# Patient Record
Sex: Female | Born: 1970 | Race: Black or African American | Hispanic: No | Marital: Married | Smoking: Former smoker
Health system: Southern US, Community
[De-identification: ages and names within clinical notes are randomized; demographics above are authoritative.]

## PROBLEM LIST (undated history)

## (undated) DIAGNOSIS — E7800 Pure hypercholesterolemia, unspecified: Secondary | ICD-10-CM

## (undated) HISTORY — PX: KIDNEY STONE SURGERY: SHX686

## (undated) HISTORY — DX: Pure hypercholesterolemia, unspecified: E78.00

---

## 1997-11-10 ENCOUNTER — Emergency Department (HOSPITAL_COMMUNITY): Admission: EM | Admit: 1997-11-10 | Discharge: 1997-11-10 | Payer: Self-pay | Admitting: Emergency Medicine

## 1997-11-10 ENCOUNTER — Encounter: Payer: Self-pay | Admitting: Emergency Medicine

## 1998-05-26 ENCOUNTER — Ambulatory Visit (HOSPITAL_COMMUNITY): Admission: RE | Admit: 1998-05-26 | Discharge: 1998-05-26 | Payer: Self-pay | Admitting: Urology

## 1998-05-26 ENCOUNTER — Encounter: Payer: Self-pay | Admitting: Urology

## 1999-03-11 ENCOUNTER — Emergency Department (HOSPITAL_COMMUNITY): Admission: EM | Admit: 1999-03-11 | Discharge: 1999-03-11 | Payer: Self-pay | Admitting: *Deleted

## 2001-06-04 ENCOUNTER — Encounter: Payer: Self-pay | Admitting: Emergency Medicine

## 2001-06-04 ENCOUNTER — Emergency Department (HOSPITAL_COMMUNITY): Admission: EM | Admit: 2001-06-04 | Discharge: 2001-06-04 | Payer: Self-pay | Admitting: Emergency Medicine

## 2001-06-10 ENCOUNTER — Emergency Department (HOSPITAL_COMMUNITY): Admission: EM | Admit: 2001-06-10 | Discharge: 2001-06-10 | Payer: Self-pay | Admitting: Emergency Medicine

## 2001-08-10 ENCOUNTER — Emergency Department (HOSPITAL_COMMUNITY): Admission: EM | Admit: 2001-08-10 | Discharge: 2001-08-10 | Payer: Self-pay

## 2003-02-03 ENCOUNTER — Emergency Department (HOSPITAL_COMMUNITY): Admission: AD | Admit: 2003-02-03 | Discharge: 2003-02-03 | Payer: Self-pay | Admitting: Family Medicine

## 2004-10-16 ENCOUNTER — Emergency Department (HOSPITAL_COMMUNITY): Admission: EM | Admit: 2004-10-16 | Discharge: 2004-10-17 | Payer: Self-pay | Admitting: Emergency Medicine

## 2005-03-31 ENCOUNTER — Emergency Department (HOSPITAL_COMMUNITY): Admission: EM | Admit: 2005-03-31 | Discharge: 2005-03-31 | Payer: Self-pay | Admitting: Family Medicine

## 2006-07-03 ENCOUNTER — Emergency Department (HOSPITAL_COMMUNITY): Admission: EM | Admit: 2006-07-03 | Discharge: 2006-07-03 | Payer: Self-pay | Admitting: Emergency Medicine

## 2012-08-19 ENCOUNTER — Encounter (HOSPITAL_COMMUNITY): Payer: Self-pay | Admitting: Emergency Medicine

## 2012-08-19 ENCOUNTER — Emergency Department (HOSPITAL_COMMUNITY)
Admission: EM | Admit: 2012-08-19 | Discharge: 2012-08-19 | Disposition: A | Discharge status: Home / Self Care | Payer: Self-pay | Attending: Emergency Medicine | Admitting: Emergency Medicine

## 2012-08-19 DIAGNOSIS — F411 Generalized anxiety disorder: Secondary | ICD-10-CM | POA: Insufficient documentation

## 2012-08-19 DIAGNOSIS — R209 Unspecified disturbances of skin sensation: Secondary | ICD-10-CM | POA: Insufficient documentation

## 2012-08-19 DIAGNOSIS — R202 Paresthesia of skin: Secondary | ICD-10-CM

## 2012-08-19 DIAGNOSIS — R6884 Jaw pain: Secondary | ICD-10-CM | POA: Insufficient documentation

## 2012-08-19 DIAGNOSIS — K029 Dental caries, unspecified: Secondary | ICD-10-CM | POA: Insufficient documentation

## 2012-08-19 LAB — BASIC METABOLIC PANEL
BUN: 5 mg/dL — ABNORMAL LOW (ref 6–23)
CO2: 25 mEq/L (ref 19–32)
Calcium: 9.4 mg/dL (ref 8.4–10.5)
GFR calc non Af Amer: >90 mL/min (ref >90)
Glucose, Bld: 98 mg/dL (ref 70–99)
Potassium: 3.5 mEq/L (ref 3.5–5.1)

## 2012-08-19 LAB — CBC
HCT: 36 % (ref 36.0–46.0)
Hemoglobin: 11.4 g/dL — ABNORMAL LOW (ref 12.0–15.0)
MCH: 24.9 pg — ABNORMAL LOW (ref 26.0–34.0)
MCHC: 31.7 g/dL (ref 30.0–36.0)
RBC: 4.58 MIL/uL (ref 3.87–5.11)

## 2012-08-19 MED ORDER — NAPROXEN 500 MG PO TABS: 500.0000 mg | ORAL_TABLET | Freq: Two times a day (BID) | ORAL | Status: DC

## 2012-08-19 NOTE — ED Provider Notes (Signed)
History    CSN: [id number] Arrival date & time 08/19/12  1150  First MD Initiated Contact with Patient 08/19/12 1158     Chief Complaint  Patient presents with  . Jaw Pain   (Consider location/radiation/quality/duration/timing/severity/associated sxs/prior Treatment) HPI Comments: Patient with no significant past medical history presents with complaint of left arm numbness and weakness it woke the patient from sleep overnight. The symptoms lasted for several minutes and gradually resolved. She continues to complain about mild tingling in the fingers. Symptoms seem to be worse with certain positions. No current weakness or numbness. No neck pain or shoulder pain. Patient also notes that she has had some mild left lower jaw pain for the past week. She denies dental pain or swelling. She took ibuprofen without relief. No chest pain or shortness of breath. Patient is concerned that she had a stroke. Onset of symptoms gradual. Course is constant.  The history is provided by the patient.   History reviewed. No pertinent past medical history. Past Surgical History  Procedure Laterality Date  . Kidney stone surgery     No family history on file. History  Substance Use Topics  . Smoking status: Never Smoker   . Smokeless tobacco: Not on file  . Alcohol Use: No   OB History   Grav Para Term Preterm Abortions TAB SAB Ect Mult Living                 Review of Systems  Constitutional: Negative for fever and activity change.  HENT: Positive for dental problem. Negative for ear pain, sore throat, facial swelling, rhinorrhea, trouble swallowing and neck pain.   Eyes: Negative for redness.  Respiratory: Negative for cough, shortness of breath and stridor.   Cardiovascular: Negative for chest pain.  Gastrointestinal: Negative for nausea, vomiting, abdominal pain and diarrhea.  Genitourinary: Negative for dysuria.  Musculoskeletal: Positive for arthralgias. Negative for myalgias, back pain and  joint swelling.  Skin: Negative for color change, rash and wound.  Neurological: Positive for numbness. Negative for weakness and headaches.    Allergies  Review of patient's allergies indicates no known allergies.  Home Medications   Current Outpatient Rx  Name  Route  Sig  Dispense  Refill  . ibuprofen (ADVIL,MOTRIN) 200 MG tablet   Oral   Take 400 mg by mouth every 8 (eight) hours as needed for pain.          BP 115/77  Pulse 64  Temp(Src) 98.8 F (37.1 C) (Oral)  Resp 16  SpO2 100%  LMP 08/12/2012 Physical Exam  Nursing note and vitals reviewed. Constitutional: She is oriented to person, place, and time. She appears well-developed and well-nourished.  HENT:  Head: Normocephalic and atraumatic. No trismus in the jaw.  Right Ear: Tympanic membrane, external ear and ear canal normal.  Left Ear: Tympanic membrane, external ear and ear canal normal.  Nose: Nose normal.  Mouth/Throat: Uvula is midline, oropharynx is clear and moist and mucous membranes are normal. Mucous membranes are not dry. Abnormal dentition. Dental caries present. No dental abscesses or edematous. No tonsillar abscesses.  No dental swelling or erythema noted on exam. Full ROM of jaw.   Eyes: Conjunctivae are normal. Right eye exhibits no discharge. Left eye exhibits no discharge.  Neck: Trachea normal and normal range of motion. Neck supple. Normal carotid pulses and no JVD present. No muscular tenderness present. Carotid bruit is not present. No tracheal deviation present.  No neck swelling or Ludwig's angina  Cardiovascular:  Normal rate, regular rhythm, S1 normal, S2 normal, normal heart sounds and intact distal pulses.  Exam reveals no decreased pulses.   No murmur heard. Pulmonary/Chest: Effort normal and breath sounds normal. No respiratory distress. She has no wheezes. She exhibits no tenderness.  Abdominal: Soft. Normal aorta and bowel sounds are normal. There is no tenderness. There is no rebound  and no guarding.  Musculoskeletal: Normal range of motion. She exhibits no tenderness.  Lymphadenopathy:    She has no cervical adenopathy.  Neurological: She is alert and oriented to person, place, and time. She has normal strength and normal reflexes. No cranial nerve deficit or sensory deficit. Coordination normal. GCS eye subscore is 4. GCS verbal subscore is 5. GCS motor subscore is 6.  Skin: Skin is warm and dry. She is not diaphoretic. No cyanosis. No pallor.  Psychiatric: Her mood appears anxious.    ED Course  Procedures (including critical care time) Labs Reviewed  CBC - Abnormal; Notable for the following:    Hemoglobin 11.4 (*)    MCH 24.9 (*)    All other components within normal limits  BASIC METABOLIC PANEL - Abnormal; Notable for the following:    BUN 5 (*)    All other components within normal limits  POCT I-STAT TROPONIN I   No results found. 1. Paresthesia   2. Jaw pain     1:19 PM Patient seen and examined. Work-up initiated per nursing protocol (chest pain). Patient exam is normal. She appears anxious.   Vital signs reviewed and are as follows: Filed Vitals:   08/19/12 1203  BP: 115/77  Pulse: 64  Temp: 98.8 F (37.1 C)  Resp: 16     Date: 08/19/2012  Rate: 60  Rhythm: normal sinus rhythm  QRS Axis: normal  Intervals: normal  ST/T Wave abnormalities: normal  Conduction Disutrbances:none  Narrative Interpretation:   Old EKG Reviewed: unchanged from 07/03/2006  2:33 PM Troponin nml.   Pt informed of results. Patient has had no recurrent symptoms in emergency department.  Patient informs to use NSAIDs on conservative management. She is to followup with orthopedic if continuing to have intermittent symptoms in one week.  Patient urged to return with worsening or other symptoms. Patient counseled to return if they have weakness in their arms or legs, slurred speech, trouble walking or talking, confusion, trouble with their balance, or if they have  any other concerns. Patient verbalizes understanding and agrees with plan.   MDM  Left upper extremity paresthesia. Per history, this is positional. It is short lived and is improved with movement. Patient had no color change or signs of limb ischemia. Her pulses are normal in emergency department. She has no history of blood clots. Do not suspect DVT. Suspect peripheral neurological etiology given the positional nature. Do not suspect stroke. Patient is otherwise neurologically intact. Feel patient is stable for discharge home with orthopedic followup.  Jaw pain. No evidence of trauma or fracture. Evidence of dental infection or skin infection. Treat with NSAIDs. Full range of motion in jaw.    Renne Crigler, PA-C 08/19/12 [phone]

## 2012-08-19 NOTE — ED Notes (Addendum)
Pt from home c/o L jaw, arm pain x 1 week. Pt denies SOB,CP. Pt denies injury or teeth problems. Pt adds that she is having tingling in her L fingers. Pt A&O and in NAD.

## 2012-08-19 NOTE — ED Provider Notes (Signed)
Medical screening examination/treatment/procedure(s) were performed by non-physician practitioner and as supervising physician I was immediately available for consultation/collaboration.  Doug Sou, MD 08/19/12 [phone]

## 2015-10-30 ENCOUNTER — Emergency Department (HOSPITAL_COMMUNITY): Payer: Self-pay

## 2015-10-30 ENCOUNTER — Encounter (HOSPITAL_COMMUNITY): Payer: Self-pay | Admitting: *Deleted

## 2015-10-30 ENCOUNTER — Emergency Department (HOSPITAL_COMMUNITY)
Admission: EM | Admit: 2015-10-30 | Discharge: 2015-10-30 | Disposition: A | Discharge status: Home / Self Care | Payer: Self-pay | Attending: Emergency Medicine | Admitting: Emergency Medicine

## 2015-10-30 DIAGNOSIS — Z791 Long term (current) use of non-steroidal anti-inflammatories (NSAID): Secondary | ICD-10-CM | POA: Insufficient documentation

## 2015-10-30 DIAGNOSIS — N3000 Acute cystitis without hematuria: Secondary | ICD-10-CM | POA: Insufficient documentation

## 2015-10-30 DIAGNOSIS — R202 Paresthesia of skin: Secondary | ICD-10-CM

## 2015-10-30 LAB — URINALYSIS, ROUTINE W REFLEX MICROSCOPIC
Bilirubin Urine: NEGATIVE
Glucose, UA: NEGATIVE mg/dL
Ketones, ur: 15 mg/dL — AB
Nitrite: POSITIVE — AB
Protein, ur: NEGATIVE mg/dL
Specific Gravity, Urine: 1.015 (ref 1.005–1.030)
pH: 5.5 (ref 5.0–8.0)

## 2015-10-30 LAB — CBC WITH DIFFERENTIAL/PLATELET
Basophils Absolute: 0 10*3/uL (ref 0.0–0.1)
Basophils Relative: 0 %
Eosinophils Absolute: 0.1 10*3/uL (ref 0.0–0.7)
Eosinophils Relative: 1 %
HCT: 36.4 % (ref 36.0–46.0)
Hemoglobin: 11.4 g/dL — ABNORMAL LOW (ref 12.0–15.0)
Lymphocytes Relative: 45 %
Lymphs Abs: 4 10*3/uL (ref 0.7–4.0)
MCH: 25 pg — ABNORMAL LOW (ref 26.0–34.0)
MCHC: 31.3 g/dL (ref 30.0–36.0)
MCV: 79.8 fL (ref 78.0–100.0)
Monocytes Absolute: 0.5 10*3/uL (ref 0.1–1.0)
Monocytes Relative: 5 %
Neutro Abs: 4.3 10*3/uL (ref 1.7–7.7)
Neutrophils Relative %: 49 %
Platelets: 370 10*3/uL (ref 150–400)
RBC: 4.56 MIL/uL (ref 3.87–5.11)
RDW: 14.9 % (ref 11.5–15.5)
WBC: 8.9 10*3/uL (ref 4.0–10.5)

## 2015-10-30 LAB — BASIC METABOLIC PANEL
Anion gap: 9 (ref 5–15)
BUN: <5 mg/dL — ABNORMAL LOW (ref 6–20)
CO2: 25 mmol/L (ref 22–32)
Calcium: 9.4 mg/dL (ref 8.9–10.3)
Chloride: 104 mmol/L (ref 101–111)
Creatinine, Ser: 0.73 mg/dL (ref 0.44–1.00)
GFR calc Af Amer: >60 mL/min (ref >60)
GFR calc non Af Amer: >60 mL/min (ref >60)
Glucose, Bld: 90 mg/dL (ref 65–99)
Potassium: 3.4 mmol/L — ABNORMAL LOW (ref 3.5–5.1)
Sodium: 138 mmol/L (ref 135–145)

## 2015-10-30 LAB — CBG MONITORING, ED: GLUCOSE-CAPILLARY: 81 mg/dL (ref 65–99)

## 2015-10-30 LAB — URINE MICROSCOPIC-ADD ON

## 2015-10-30 LAB — PREGNANCY, URINE: Preg Test, Ur: NEGATIVE

## 2015-10-30 MED ORDER — SODIUM CHLORIDE 0.9 % IV BOLUS (SEPSIS)
1000.0000 mL | Freq: Once | INTRAVENOUS | Status: AC
  Administered 2015-10-30: 1000 mL via INTRAVENOUS

## 2015-10-30 MED ORDER — CEPHALEXIN 500 MG PO CAPS: 1000.0000 mg | ORAL_CAPSULE | Freq: Two times a day (BID) | ORAL | 0 refills | Status: DC

## 2015-10-30 NOTE — Progress Notes (Signed)
Patient listed as no having insurance or a pcp.  EDCM spoke to patient at bedside.  Patient reports she has [employer] insurance.  [facility]

## 2015-10-30 NOTE — Discharge Instructions (Signed)
Increase her fluid intake.  Return here as needed.  Follow-up with your primary care doctor

## 2015-10-30 NOTE — ED Triage Notes (Signed)
C/o headache and tingling for about a week, tingling will go into both arms and fingers. Nothing relieves headache, c/o nausea.

## 2015-10-31 NOTE — ED Provider Notes (Signed)
[facility]-EMERGENCY DEPT Provider Note   CSN: [id number] Arrival date & time: 10/30/15  1508     History   Chief Complaint Chief Complaint  Patient presents with  . Dizziness  . Tingling    HPI Megan Dawson is a 45 y.o. female.  HPI Patient presents to the emergency department with tingling throughout her head and upper extremities.  Patient states that she has had headache for a week along with this tingling that is bilateral.  Patient states that nothing seems make the condition better or worse.  She states that she has had some nausea but no vomiting.  Patient states that she has never had symptoms like this in the past. The patient denies chest pain, shortness of breath,blurred vision, neck pain, fever, cough, weakness, numbness, dizziness, anorexia, edema, abdominal pain, nausea, vomiting, diarrhea, rash, back pain, dysuria, hematemesis, bloody stool, near syncope, or syncope. History reviewed. No pertinent past medical history.  There are no active problems to display for this patient.   Past Surgical History:  Procedure Laterality Date  . KIDNEY STONE SURGERY      OB History    No data available       Home Medications    Prior to Admission medications   Medication Sig Start Date End Date Taking? Authorizing Provider  cephALEXin (KEFLEX) 500 MG capsule Take 2 capsules (1,000 mg total) by mouth 2 (two) times daily. 10/30/15   Charlestine Night, PA-C  ibuprofen (ADVIL,MOTRIN) 200 MG tablet Take 400 mg by mouth every 8 (eight) hours as needed for pain.    Historical Provider, MD  naproxen (NAPROSYN) 500 MG tablet Take 1 tablet (500 mg total) by mouth 2 (two) times daily. Patient not taking: Reported on 10/30/2015 08/19/12   Renne Crigler, PA-C    Family History No family history on file.  Social History Social History  Substance Use Topics  . Smoking status: Never Smoker  . Smokeless tobacco: Never Used  . Alcohol use No     Allergies   Review of  patient's allergies indicates no known allergies.   Review of Systems Review of Systems All other systems negative except as documented in the HPI. All pertinent positives and negatives as reviewed in the HPI.  Physical Exam Updated Vital Signs BP 106/67   Pulse (!) 57   Temp 98.1 F (36.7 C) (Oral)   Resp 12   Ht 5\' 6"  (1.676 m)   LMP 10/29/2015 (Exact Date)   SpO2 100%   Physical Exam  Constitutional: She is oriented to person, place, and time. She appears well-developed and well-nourished. No distress.  HENT:  Head: Normocephalic and atraumatic.  Mouth/Throat: Oropharynx is clear and moist.  Eyes: Pupils are equal, round, and reactive to light.  Neck: Normal range of motion. Neck supple.  Cardiovascular: Normal rate, regular rhythm and normal heart sounds.  Exam reveals no gallop and no friction rub.   No murmur heard. Pulmonary/Chest: Effort normal and breath sounds normal. No respiratory distress. She has no wheezes.  Abdominal: Soft. Bowel sounds are normal. She exhibits no distension. There is no tenderness.  Neurological: She is alert and oriented to person, place, and time. She has normal strength and normal reflexes. No sensory deficit. She exhibits normal muscle tone. Coordination and gait normal. GCS eye subscore is 4. GCS verbal subscore is 5. GCS motor subscore is 6.  Skin: Skin is warm and dry. No rash noted. No erythema.  Psychiatric: She has a normal mood and affect. Her  behavior is normal.  Nursing note and vitals reviewed.    ED Treatments / Results  Labs (all labs ordered are listed, but only abnormal results are displayed) Labs Reviewed  BASIC METABOLIC PANEL - Abnormal; Notable for the following:       Result Value   Potassium 3.4 (*)    BUN <5 (*)    All other components within normal limits  CBC WITH DIFFERENTIAL/PLATELET - Abnormal; Notable for the following:    Hemoglobin 11.4 (*)    MCH 25.0 (*)    All other components within normal limits    URINALYSIS, ROUTINE W REFLEX MICROSCOPIC (NOT [facility]) - Abnormal; Notable for the following:    APPearance CLOUDY (*)    Hgb urine dipstick LARGE (*)    Ketones, ur 15 (*)    Nitrite POSITIVE (*)    Leukocytes, UA TRACE (*)    All other components within normal limits  URINE MICROSCOPIC-ADD ON - Abnormal; Notable for the following:    Squamous Epithelial / LPF 0-5 (*)    Bacteria, UA MANY (*)    All other components within normal limits  PREGNANCY, URINE  CBG MONITORING, ED    EKG  EKG Interpretation  Date/Time:  Friday October 30 2015 15:14:13 EDT Ventricular Rate:  61 PR Interval:    QRS Duration: 82 QT Interval:  424 QTC Calculation: 428 R Axis:   56 Text Interpretation:  Sinus rhythm Abnormal R-wave progression, early transition Confirmed by [facility]ARD[facility] MD, PEDRO ([id number]) on 10/30/2015 3:21:49 PM       Radiology Ct Head Wo Contrast  Result Date: 10/30/2015 CLINICAL DATA:  45 year old female with headache, neck pain and tingling in both arms and fingers for 1 week. EXAM: CT HEAD WITHOUT CONTRAST CT CERVICAL SPINE WITHOUT CONTRAST TECHNIQUE: Multidetector CT imaging of the head and cervical spine was performed following the standard protocol without intravenous contrast. Multiplanar CT image reconstructions of the cervical spine were also generated. COMPARISON:  None. FINDINGS: CT HEAD FINDINGS Brain: No evidence of infarction, hemorrhage, hydrocephalus, extra-axial collection or mass lesion/mass effect. Vascular: No hyperdense vessel or unexpected calcification. Skull: Normal. Negative for fracture or focal lesion. Sinuses/Orbits: No acute finding. Other: None. CT CERVICAL SPINE FINDINGS Alignment: Normal. Skull base and vertebrae: No acute fracture. No primary bone lesion or focal pathologic process. Soft tissues and spinal canal: No prevertebral fluid or swelling. No visible canal hematoma. Disc levels:  Unremarkable Upper chest: Negative. Other: None IMPRESSION: Unremarkable  noncontrast CTs of the head and cervical spine. Electronically Signed   By: Harmon PierJeffrey  Hu M.D.   On: 10/30/2015 19:02   Ct Cervical Spine Wo Contrast  Result Date: 10/30/2015 CLINICAL DATA:  45 year old female with headache, neck pain and tingling in both arms and fingers for 1 week. EXAM: CT HEAD WITHOUT CONTRAST CT CERVICAL SPINE WITHOUT CONTRAST TECHNIQUE: Multidetector CT imaging of the head and cervical spine was performed following the standard protocol without intravenous contrast. Multiplanar CT image reconstructions of the cervical spine were also generated. COMPARISON:  None. FINDINGS: CT HEAD FINDINGS Brain: No evidence of infarction, hemorrhage, hydrocephalus, extra-axial collection or mass lesion/mass effect. Vascular: No hyperdense vessel or unexpected calcification. Skull: Normal. Negative for fracture or focal lesion. Sinuses/Orbits: No acute finding. Other: None. CT CERVICAL SPINE FINDINGS Alignment: Normal. Skull base and vertebrae: No acute fracture. No primary bone lesion or focal pathologic process. Soft tissues and spinal canal: No prevertebral fluid or swelling. No visible canal hematoma. Disc levels:  Unremarkable Upper chest: Negative. Other:  None IMPRESSION: Unremarkable noncontrast CTs of the head and cervical spine. Electronically Signed   By: Harmon Pier M.D.   On: 10/30/2015 19:02    Procedures Procedures (including critical care time)  Medications Ordered in ED Medications  sodium chloride 0.9 % bolus 1,000 mL (0 mLs Intravenous Stopped 10/30/15 2123)     Initial Impression / Assessment and Plan / ED Course  I have reviewed the triage vital signs and the nursing notes.  Pertinent labs & imaging results that were available during my care of the patient were reviewed by me and considered in my medical decision making (see chart for details).  Clinical Course    I advised the patient of the results and all questions were answered feel that this is multifactorial and  she is feeling better following IV fluids.  The CT scans were reassuring.  The patient does have a urinary tract infection which could contribute to this along with some mild dehydration.  Advised patient to follow up with her primary care Dr. told to return here as needed  Final Clinical Impressions(s) / ED Diagnoses   Final diagnoses:  Acute cystitis without hematuria  Tingling    New Prescriptions Discharge Medication List as of 10/30/2015  9:05 PM    START taking these medications   Details  cephALEXin (KEFLEX) 500 MG capsule Take 2 capsules (1,000 mg total) by mouth 2 (two) times daily., Starting Fri 10/30/2015, Print         [employer], PA-C 10/31/15 0120    Rolland Porter, MD 11/03/15 1451

## 2020-02-12 ENCOUNTER — Other Ambulatory Visit: Payer: Self-pay

## 2020-02-12 ENCOUNTER — Ambulatory Visit
Admission: EM | Admit: 2020-02-12 | Discharge: 2020-02-12 | Disposition: A | Discharge status: Home / Self Care | Payer: 59 | Attending: Emergency Medicine | Admitting: Emergency Medicine

## 2020-02-12 DIAGNOSIS — K625 Hemorrhage of anus and rectum: Secondary | ICD-10-CM

## 2020-02-12 MED ORDER — HYDROCORTISONE ACETATE 25 MG RE SUPP: 25.0000 mg | Freq: Two times a day (BID) | RECTAL | 0 refills | Status: DC

## 2020-02-12 NOTE — Discharge Instructions (Signed)
We are checking your hemoglobin, I will call if abnormal Use Anusol suppositories twice daily to help stop bleeding Referral to gastroenterology placed for follow-up and likely colonoscopy Please go to emergency room if developing any abdominal pain, worsening bleeding, lightheadedness or dizziness

## 2020-02-12 NOTE — ED Provider Notes (Signed)
[facility]    CSN: [id number] Arrival date & time: 02/12/20  1204      History   Chief Complaint Chief Complaint  Patient presents with  . Rectal Bleeding    X 1 month    HPI Megan Dawson is a 49 y.o. female presenting today for evaluation of rectal bleeding.  Patient reports over the past month she has had bright red blood per rectum.  Worse over the past few days.  Mainly is outside of the stool.  Also notes with wiping.  Denies any swelling or pain.  History of external hemorrhoids, but does not feel similar.  Has had slight increase in lightheadedness and dizziness recently as well as some mild nausea.  Has never had colonoscopy.  Denies current abdominal pain.  Did report some mild right lower abdominal discomfort earlier today, but is unsure if this is related to being perimenopausal as she will occasionally have some vaginal bleeding following lower abdominal discomfort.  Normal oral intake.     HPI  History reviewed. No pertinent past medical history.  There are no problems to display for this patient.   Past Surgical History:  Procedure Laterality Date  . KIDNEY STONE SURGERY      OB History   No obstetric history on file.      Home Medications    Prior to Admission medications   Medication Sig Start Date End Date Taking? Authorizing Provider  hydrocortisone (ANUSOL-HC) 25 MG suppository Place 1 suppository (25 mg total) rectally 2 (two) times daily. 02/12/20  Yes Aalijah Lanphere, Junius Creamer, PA-C    Family History History reviewed. No pertinent family history.  Social History Social History   Tobacco Use  . Smoking status: Never Smoker  . Smokeless tobacco: Never Used  Vaping Use  . Vaping Use: Never used  Substance Use Topics  . Alcohol use: No  . Drug use: No     Allergies   Patient has no known allergies.   Review of Systems Review of Systems  Constitutional: Negative for fatigue and fever.  Eyes: Negative for visual  disturbance.  Respiratory: Negative for shortness of breath.   Cardiovascular: Negative for chest pain.  Gastrointestinal: Positive for blood in stool and nausea. Negative for abdominal pain and vomiting.  Musculoskeletal: Negative for arthralgias and joint swelling.  Skin: Negative for color change, rash and wound.  Neurological: Negative for dizziness, weakness, light-headedness and headaches.     Physical Exam Triage Vital Signs ED Triage Vitals  Enc Vitals Group     BP 02/12/20 1601 123/84     Pulse Rate 02/12/20 1601 77     Resp 02/12/20 1601 18     Temp --      Temp Source 02/12/20 1601 Oral     SpO2 02/12/20 1601 98 %     Weight --      Height --      Head Circumference --      Peak Flow --      Pain Score 02/12/20 1603 0     Pain Loc --      Pain Edu? --      Excl. in GC? --    No data found.  Updated Vital Signs BP 123/84 (BP Location: Left Arm)   Pulse 77   Resp 18   SpO2 98%   Visual Acuity Right Eye Distance:   Left Eye Distance:   Bilateral Distance:    Right Eye Near:   Left Eye  Near:    Bilateral Near:     Physical Exam Vitals and nursing note reviewed.  Constitutional:      Appearance: She is well-developed and well-nourished.     Comments: No acute distress  HENT:     Head: Normocephalic and atraumatic.     Nose: Nose normal.  Eyes:     Conjunctiva/sclera: Conjunctivae normal.  Cardiovascular:     Rate and Rhythm: Normal rate.  Pulmonary:     Effort: Pulmonary effort is normal. No respiratory distress.  Abdominal:     General: There is no distension.     Comments: Soft, nondistended, nontender to light deep palpation throughout abdomen  Genitourinary:    Comments: Rectum with small areas prior hemorrhoids, but no areas of erythema or tenderness, no palpable masses on rectal exam, and no obvious blood noted on glove Musculoskeletal:        General: Normal range of motion.     Cervical back: Neck supple.  Skin:    General: Skin is  warm and dry.  Neurological:     Mental Status: She is alert and oriented to person, place, and time.  Psychiatric:        Mood and Affect: Mood and affect normal.      UC Treatments / Results  Labs (all labs ordered are listed, but only abnormal results are displayed) Labs Reviewed  CBC    EKG   Radiology No results found.  Procedures Procedures (including critical care time)  Medications Ordered in UC Medications - No data to display  Initial Impression / Assessment and Plan / UC Course  I have reviewed the triage vital signs and the nursing notes.  Pertinent labs & imaging results that were available during my care of the patient were reviewed by me and considered in my medical decision making (see chart for details).     Rectal bleeding x1 month, referred to gastroenterology for further evaluation of bleeding.  Treating internal hemorrhoids with Anusol suppositories.  Checking CBC to check hemoglobin.  If worsening in the meantime to follow-up in the emergency room.  Discussed strict return precautions. Patient verbalized understanding and is agreeable with plan.  Final Clinical Impressions(s) / UC Diagnoses   Final diagnoses:  Rectal bleeding     Discharge Instructions     We are checking your hemoglobin, I will call if abnormal Use Anusol suppositories twice daily to help stop bleeding Referral to gastroenterology placed for follow-up and likely colonoscopy Please go to emergency room if developing any abdominal pain, worsening bleeding, lightheadedness or dizziness    ED Prescriptions    Medication Sig Dispense Auth. Provider   hydrocortisone (ANUSOL-HC) 25 MG suppository Place 1 suppository (25 mg total) rectally 2 (two) times daily. 16 suppository Simara Rhyner, [place] C, PA-C     PDMP not reviewed this encounter.   Lew Dawes, PA-C 02/12/20 1747

## 2020-02-12 NOTE — ED Triage Notes (Signed)
Patient is having bloody stools and states the blood drips into the toilet and is generally bright red but has also been dark. Pt states she is concerned about blood loss. Pt is aox4 and ambulatory.

## 2020-02-13 ENCOUNTER — Telehealth (INDEPENDENT_AMBULATORY_CARE_PROVIDER_SITE_OTHER): Payer: 59 | Admitting: Internal Medicine

## 2020-02-13 DIAGNOSIS — Z7689 Persons encountering health services in other specified circumstances: Secondary | ICD-10-CM | POA: Diagnosis not present

## 2020-02-13 DIAGNOSIS — K625 Hemorrhage of anus and rectum: Secondary | ICD-10-CM | POA: Diagnosis not present

## 2020-02-13 DIAGNOSIS — Z1231 Encounter for screening mammogram for malignant neoplasm of breast: Secondary | ICD-10-CM | POA: Diagnosis not present

## 2020-02-13 LAB — CBC
Hematocrit: 36.8 % (ref 34.0–46.6)
Hemoglobin: 11.4 g/dL (ref 11.1–15.9)
MCH: 24.7 pg — ABNORMAL LOW (ref 26.6–33.0)
MCHC: 31 g/dL — ABNORMAL LOW (ref 31.5–35.7)
MCV: 80 fL (ref 79–97)
Platelets: 315 10*3/uL (ref 150–450)
RBC: 4.61 x10E6/uL (ref 3.77–5.28)
RDW: 15.3 % (ref 11.7–15.4)
WBC: 8.8 10*3/uL (ref 3.4–10.8)

## 2020-02-13 NOTE — Progress Notes (Signed)
Virtual Visit via MyChart Video Note  I connected with Megan Dawson, on 02/13/2020 at 3:31 PM by MyChart Video due to the COVID-19 pandemic and verified that I am speaking with the correct person using two identifiers.   Consent: I discussed the limitations, risks, security and privacy concerns of performing an evaluation and management service by telephone and the availability of in person appointments. I also discussed with the patient that there may be a patient responsible charge related to this service. The patient expressed understanding and agreed to proceed.   Location of Patient: Home   Location of Provider: Clinic    Persons participating in Telemedicine visit: Jennier A Kristine Linea [facility] Dr. Earlene Plater      History of Present Illness: Patient has a visit to establish care. Has not been to a doctor in years. No significant PMH. Remote surgical history of kidney stone removal >20 years ago.   She went to [facility] yesterday for rectal bleeding. Thought to likely be related to hemorrhoids and was given an ointment. Referral was placed to GI for colonoscopy and she has an appointment scheduled for 1/19.   Not UTD on PAP or mammogram.    No past medical history on file. No Known Allergies  Current Outpatient Medications on File Prior to Visit  Medication Sig Dispense Refill  . hydrocortisone (ANUSOL-HC) 25 MG suppository Place 1 suppository (25 mg total) rectally 2 (two) times daily. 16 suppository 0   No current facility-administered medications on file prior to visit.    Observations/Objective: NAD. Speaking clearly.  Work of breathing normal.  Alert and oriented. Mood appropriate.   Assessment and Plan: 1. Encounter to establish care Reviewed patient's PMH, social history, surgical history, and medications.  Is overdue for annual exam, screening blood work, and health maintenance topics. Have asked patient to return for visit to address these items.    2. Rectal bleeding Has GI follow up scheduled. Needs colonoscopy especially given age and has never had one in past.   3. Encounter for screening mammogram for malignant neoplasm of breast - MM DIGITAL SCREENING BILATERAL; Future   Follow Up Instructions: Annual exam    I discussed the assessment and treatment plan with the patient. The patient was provided an opportunity to ask questions and all were answered. The patient agreed with the plan and demonstrated an understanding of the instructions.   The patient was advised to call back or seek an in-person evaluation if the symptoms worsen or if the condition fails to improve as anticipated.     I provided 10 minutes total of non-face-to-face time during this encounter including median intraservice time, reviewing previous notes, investigations, ordering medications, medical decision making, coordinating care and patient verbalized understanding at the end of the visit.    Marcy Siren, D.O. Primary Care at [facility]  02/13/2020, 3:31 PM

## 2020-03-04 ENCOUNTER — Ambulatory Visit: Payer: 59 | Admitting: Gastroenterology

## 2020-03-06 ENCOUNTER — Encounter: Payer: 59 | Admitting: Internal Medicine

## 2020-03-16 ENCOUNTER — Encounter: Payer: 59 | Admitting: Internal Medicine

## 2020-03-25 ENCOUNTER — Ambulatory Visit: Payer: 59 | Admitting: Gastroenterology

## 2020-03-27 ENCOUNTER — Encounter: Payer: Self-pay | Admitting: Internal Medicine

## 2020-03-27 ENCOUNTER — Ambulatory Visit (INDEPENDENT_AMBULATORY_CARE_PROVIDER_SITE_OTHER): Payer: 59 | Admitting: Internal Medicine

## 2020-03-27 ENCOUNTER — Other Ambulatory Visit (HOSPITAL_COMMUNITY)
Admission: RE | Admit: 2020-03-27 | Discharge: 2020-03-27 | Disposition: A | Discharge status: Home / Self Care | Payer: 59 | Source: Ambulatory Visit | Attending: Internal Medicine | Admitting: Internal Medicine

## 2020-03-27 ENCOUNTER — Other Ambulatory Visit: Payer: Self-pay

## 2020-03-27 VITALS — BP 105/71 | HR 64 | Temp 98.7°F | Resp 18 | Ht 66.0 in | Wt 226.0 lb

## 2020-03-27 DIAGNOSIS — Z1211 Encounter for screening for malignant neoplasm of colon: Secondary | ICD-10-CM

## 2020-03-27 DIAGNOSIS — Z130 Encounter for screening for diseases of the blood and blood-forming organs and certain disorders involving the immune mechanism: Secondary | ICD-10-CM | POA: Diagnosis not present

## 2020-03-27 DIAGNOSIS — Z114 Encounter for screening for human immunodeficiency virus [HIV]: Secondary | ICD-10-CM

## 2020-03-27 DIAGNOSIS — H539 Unspecified visual disturbance: Secondary | ICD-10-CM

## 2020-03-27 DIAGNOSIS — Z1159 Encounter for screening for other viral diseases: Secondary | ICD-10-CM | POA: Diagnosis not present

## 2020-03-27 DIAGNOSIS — Z124 Encounter for screening for malignant neoplasm of cervix: Secondary | ICD-10-CM | POA: Diagnosis present

## 2020-03-27 DIAGNOSIS — Z0000 Encounter for general adult medical examination without abnormal findings: Secondary | ICD-10-CM | POA: Diagnosis not present

## 2020-03-27 DIAGNOSIS — Z13228 Encounter for screening for other metabolic disorders: Secondary | ICD-10-CM | POA: Diagnosis not present

## 2020-03-27 DIAGNOSIS — Z1231 Encounter for screening mammogram for malignant neoplasm of breast: Secondary | ICD-10-CM

## 2020-03-27 NOTE — Progress Notes (Signed)
Has some concerns w/ vision, would like a referral to eye doc

## 2020-03-27 NOTE — Progress Notes (Signed)
Subjective:    Megan Dawson - 50 y.o. female MRN [id number]  Date of birth: December 01, 1970  HPI  Megan Dawson is here for annual exam. Has concerns about her vision. Reports she needs reading glasses now. Sometimes feels like she is straining to read things farther away or her vision will become blurry. Wonders if this is related to aging. Has not had eye exam in a while.   Depression screen PHQ 2/9 03/27/2020  Decreased Interest 1  Down, Depressed, Hopeless 1  PHQ - 2 Score 2  Altered sleeping 1  Tired, decreased energy 3  Change in appetite 2  Feeling bad or failure about yourself  1  Trouble concentrating 1  Moving slowly or fidgety/restless 0  Suicidal thoughts 0  PHQ-9 Score 10  Difficult doing work/chores Not difficult at all    Health Maintenance:  Health Maintenance Due  Topic Date Due  . Hepatitis C Screening  Never done  . HIV Screening  Never done  . PAP SMEAR-Modifier  Never done  . COLONOSCOPY (Pts 45-89yrs Insurance coverage will need to be confirmed)  Never done  . MAMMOGRAM  Never done    -  reports that she has never smoked. She has never used smokeless tobacco. - Review of Systems: Per HPI. - Past Medical History: There are no problems to display for this patient.  - Medications: reviewed and updated   Objective:   Physical Exam BP 105/71 (BP Location: Right Arm, Patient Position: Sitting, Cuff Size: Large)   Pulse 64   Temp 98.7 F (37.1 C) (Oral)   Resp 18   Ht 5\' 6"  (1.676 m)   Wt 226 lb (102.5 kg)   SpO2 96%   BMI 36.48 kg/m  Physical Exam Constitutional:      Appearance: She is not diaphoretic.  HENT:     Head: Normocephalic and atraumatic.     Mouth/Throat:     Mouth: Oropharynx is clear and moist.      Comments: TMs normal bilaterally Eyes:     Extraocular Movements: EOM normal.     Conjunctiva/sclera: Conjunctivae normal.     Pupils: Pupils are equal, round, and reactive to light.  Neck:     Thyroid: No thyromegaly.   Cardiovascular:     Rate and Rhythm: Normal rate and regular rhythm.     Pulses: Intact distal pulses.     Heart sounds: Normal heart sounds. No murmur heard.   Pulmonary:     Effort: Pulmonary effort is normal. No respiratory distress.     Breath sounds: Normal breath sounds. No wheezing.  Abdominal:     General: Bowel sounds are normal. There is no distension.     Palpations: Abdomen is soft.     Tenderness: There is no abdominal tenderness. There is no guarding or rebound.  Genitourinary:    Comments: GU/GYN: Exam performed in the presence of a chaperone. External genitalia within normal limits.  Vaginal mucosa pink, moist, normal rugae.  Nonfriable cervix without lesions, no discharge or bleeding noted on speculum exam.  Bimanual exam revealed normal, nongravid uterus.  No cervical motion tenderness. No adnexal masses bilaterally.   Musculoskeletal:        General: No deformity or edema. Normal range of motion.     Cervical back: Normal range of motion and neck supple.  Lymphadenopathy:     Cervical: No cervical adenopathy.  Skin:    General: Skin is warm and dry.     Findings: No  rash.  Neurological:     Mental Status: She is alert and oriented to person, place, and time.     Gait: Gait is intact.  Psychiatric:        Mood and Affect: Mood and affect normal.        Judgment: Judgment normal.            Assessment & Plan:   1. Encounter for annual physical exam Counseled on 150 minutes of exercise per week, healthy eating (including decreased daily intake of saturated fats, cholesterol, added sugars, sodium), STI prevention, routine healthcare maintenance.  2. Screening for metabolic disorder - Comprehensive metabolic panel - Lipid panel  3. Screening for deficiency anemia - CBC  4. Need for hepatitis C screening test - Hepatitis C antibody  5. Screening for HIV (human immunodeficiency virus) - HIV Antibody (routine testing w rflx)  6. Screening for colon  cancer Has colonoscopy scheduled for the end of this month.   7. Encounter for screening mammogram for malignant neoplasm of breast - MM DIGITAL SCREENING BILATERAL; Future  8. Screening for cervical cancer - Cytology - PAP([place])  9. Vision changes - Ambulatory referral to Optometry   Marcy Siren, D.O. 03/27/2020, 9:47 AM Primary Care at [facility]

## 2020-03-28 LAB — COMPREHENSIVE METABOLIC PANEL
ALT: 16 IU/L (ref 0–32)
AST: 16 IU/L (ref 0–40)
Albumin/Globulin Ratio: 1.5 (ref 1.2–2.2)
Albumin: 4.2 g/dL (ref 3.8–4.8)
Alkaline Phosphatase: 54 IU/L (ref 44–121)
BUN/Creatinine Ratio: 10 (ref 9–23)
BUN: 8 mg/dL (ref 6–24)
Bilirubin Total: 0.2 mg/dL (ref 0.0–1.2)
CO2: 23 mmol/L (ref 20–29)
Calcium: 9.2 mg/dL (ref 8.7–10.2)
Chloride: 103 mmol/L (ref 96–106)
Creatinine, Ser: 0.79 mg/dL (ref 0.57–1.00)
GFR calc Af Amer: 101 mL/min/{1.73_m2} (ref >59)
GFR calc non Af Amer: 88 mL/min/{1.73_m2} (ref >59)
Globulin, Total: 2.8 g/dL (ref 1.5–4.5)
Glucose: 76 mg/dL (ref 65–99)
Potassium: 4.6 mmol/L (ref 3.5–5.2)
Sodium: 142 mmol/L (ref 134–144)
Total Protein: 7 g/dL (ref 6.0–8.5)

## 2020-03-28 LAB — CBC
Hematocrit: 34.8 % (ref 34.0–46.6)
Hemoglobin: 10.9 g/dL — ABNORMAL LOW (ref 11.1–15.9)
MCH: 24.6 pg — ABNORMAL LOW (ref 26.6–33.0)
MCHC: 31.3 g/dL — ABNORMAL LOW (ref 31.5–35.7)
MCV: 79 fL (ref 79–97)
Platelets: 332 10*3/uL (ref 150–450)
RBC: 4.43 x10E6/uL (ref 3.77–5.28)
RDW: 14.6 % (ref 11.7–15.4)
WBC: 5.5 10*3/uL (ref 3.4–10.8)

## 2020-03-28 LAB — LIPID PANEL
Chol/HDL Ratio: 2.9 ratio (ref 0.0–4.4)
Cholesterol, Total: 214 mg/dL — ABNORMAL HIGH (ref 100–199)
HDL: 75 mg/dL (ref >39)
LDL Chol Calc (NIH): 125 mg/dL — ABNORMAL HIGH (ref 0–99)
Triglycerides: 77 mg/dL (ref 0–149)
VLDL Cholesterol Cal: 14 mg/dL (ref 5–40)

## 2020-03-28 LAB — HIV ANTIBODY (ROUTINE TESTING W REFLEX): HIV Screen 4th Generation wRfx: NONREACTIVE

## 2020-03-28 LAB — HEPATITIS C ANTIBODY: Hep C Virus Ab: <0.1 s/co ratio (ref 0.0–0.9)

## 2020-03-31 LAB — CYTOLOGY - PAP
Adequacy: ABSENT
Comment: NEGATIVE
Diagnosis: NEGATIVE
Diagnosis: REACTIVE
High risk HPV: NEGATIVE

## 2020-04-02 ENCOUNTER — Other Ambulatory Visit: Payer: Self-pay | Admitting: Internal Medicine

## 2020-04-02 DIAGNOSIS — N880 Leukoplakia of cervix uteri: Secondary | ICD-10-CM

## 2020-04-02 MED ORDER — METRONIDAZOLE 500 MG PO TABS: 500.0000 mg | ORAL_TABLET | Freq: Two times a day (BID) | ORAL | 0 refills | Status: DC

## 2020-04-07 ENCOUNTER — Encounter: Payer: Self-pay | Admitting: Gastroenterology

## 2020-04-07 ENCOUNTER — Ambulatory Visit (INDEPENDENT_AMBULATORY_CARE_PROVIDER_SITE_OTHER): Payer: 59 | Admitting: Gastroenterology

## 2020-04-07 ENCOUNTER — Other Ambulatory Visit: Payer: Self-pay

## 2020-04-07 VITALS — BP 112/80 | HR 52 | Ht 66.5 in | Wt 223.1 lb

## 2020-04-07 DIAGNOSIS — K625 Hemorrhage of anus and rectum: Secondary | ICD-10-CM

## 2020-04-07 DIAGNOSIS — Z1211 Encounter for screening for malignant neoplasm of colon: Secondary | ICD-10-CM | POA: Diagnosis not present

## 2020-04-07 MED ORDER — NA SULFATE-K SULFATE-MG SULF 17.5-3.13-1.6 GM/177ML PO SOLN: 1.0000 | Freq: Once | ORAL | 0 refills | Status: AC

## 2020-04-07 NOTE — Progress Notes (Signed)
Reviewed and agree with management plan.  Malcolm T. Stark, MD FACG [phone]  

## 2020-04-07 NOTE — Patient Instructions (Signed)
If you are age 52 or older, your body mass index should be between 23-30. Your Body mass index is 35.47 kg/m. If this is out of the aforementioned range listed, please consider follow up with your Primary Care Provider.  If you are age 61 or younger, your body mass index should be between 19-25. Your Body mass index is 35.47 kg/m. If this is out of the aformentioned range listed, please consider follow up with your Primary Care Provider.   You have been scheduled for a colonoscopy. Please follow written instructions given to you at your visit today.  Please pick up your prep supplies at the pharmacy within the next 1-3 days. If you use inhalers (even only as needed), please bring them with you on the day of your procedure.  Due to recent changes in healthcare laws, you may see the results of your imaging and laboratory studies on MyChart before your provider has had a chance to review them.  We understand that in some cases there may be results that are confusing or concerning to you. Not all laboratory results come back in the same time frame and the provider may be waiting for multiple results in order to interpret others.  Please give [place] 48 hours in order for your provider to thoroughly review all the results before contacting the office for clarification of your results.

## 2020-04-07 NOTE — Progress Notes (Signed)
     04/07/2020 Megan Dawson [id number] [birth year]   HISTORY OF PRESENT ILLNESS: This is a pleasant 50 year old female who is new to our office.  She has been referred here by Patterson Hammersmith, PA-C, for follow-up of her recent rectal bleeding.  She tells me that over a month ago she had some issues with bright red rectal bleeding that lasted for a couple of weeks.  She has not seen any blood since it resolved over a month ago.  She reports that she's had some issues with hemorrhoids in the past, but no bleeding to that degree.  She moves her bowels regularly.  Denies abdominal pain.  She has never had a colonoscopy in the past so would like to proceed with scheduling that.   Past Medical History:  Diagnosis Date  . Elevated cholesterol    Past Surgical History:  Procedure Laterality Date  . KIDNEY STONE SURGERY      reports that she has quit smoking. She has never used smokeless tobacco. She reports current alcohol use. She reports that she does not use drugs. family history is not on file. No Known Allergies    Outpatient Encounter Medications as of 04/07/2020  Medication Sig  . ELDERBERRY PO Take by mouth daily.  . metroNIDAZOLE (FLAGYL) 500 MG tablet Take 1 tablet (500 mg total) by mouth 2 (two) times daily.  . Multiple Vitamin (MULTIVITAMIN) tablet Take 1 tablet by mouth daily.  . Zinc Sulfate (ZINC-220 PO) Take by mouth daily.  . [DISCONTINUED] cholecalciferol (VITAMIN D3) 25 MCG (1000 UNIT) tablet Take 1,000 Units by mouth daily.  . [DISCONTINUED] hydrocortisone (ANUSOL-HC) 25 MG suppository Place 1 suppository (25 mg total) rectally 2 (two) times daily. (Patient not taking: Reported on 03/27/2020)   No facility-administered encounter medications on file as of 04/07/2020.     REVIEW OF SYSTEMS  : All other systems reviewed and negative except where noted in the History of Present Illness.   PHYSICAL EXAM: BP 112/80   Pulse (!) 52   Ht 5' 6.5" (1.689 m)   Wt 223 lb 2  oz (101.2 kg)   BMI 35.47 kg/m  General: Well developed AA female in no acute distress Head: Normocephalic and atraumatic Eyes:  Sclerae anicteric, conjunctiva pink. Ears: Normal auditory acuity Lungs: Clear throughout to auscultation; no W/R/R. Heart: Bradycardic; no M/R/G. Abdomen: Soft, non-distended.  BS present.  Non-tender. Rectal:  Will be done at the time of colonoscopy. Musculoskeletal: Symmetrical with no gross deformities  Skin: No lesions on visible extremities Extremities: No edema  Neurological: Alert oriented x 4, grossly non-focal Psychological:  Alert and cooperative. Normal mood and affect  ASSESSMENT AND PLAN: *CRC screening:  Never had colonoscopy in the past.  Will schedule with Dr. Russella Dar. *Rectal bleeding:  Was having BRBPR over a month ago.  Resolved spontaneously.  Has history of hemorrhoids.  Was likely hemorrhoidal bleeding.    **The risks, benefits, and alternatives to colonoscopy were discussed with the patient and she consents to proceed.    CC:  Wieters, [place] C, PA-C

## 2020-04-08 ENCOUNTER — Encounter: Payer: Self-pay | Admitting: Gastroenterology

## 2020-04-15 ENCOUNTER — Other Ambulatory Visit: Payer: Self-pay | Admitting: Gastroenterology

## 2020-04-16 LAB — SARS CORONAVIRUS 2 (TAT 6-24 HRS): SARS Coronavirus 2: NEGATIVE

## 2020-04-17 ENCOUNTER — Ambulatory Visit (AMBULATORY_SURGERY_CENTER): Payer: 59 | Admitting: Gastroenterology

## 2020-04-17 ENCOUNTER — Other Ambulatory Visit: Payer: Self-pay

## 2020-04-17 ENCOUNTER — Encounter: Payer: Self-pay | Admitting: Gastroenterology

## 2020-04-17 VITALS — BP 106/65 | HR 55 | Temp 96.6°F | Resp 13 | Ht 66.5 in | Wt 223.0 lb

## 2020-04-17 DIAGNOSIS — Z1211 Encounter for screening for malignant neoplasm of colon: Secondary | ICD-10-CM | POA: Diagnosis present

## 2020-04-17 MED ORDER — SODIUM CHLORIDE 0.9 % IV SOLN: 500.0000 mL | Freq: Once | INTRAVENOUS | Status: DC

## 2020-04-17 NOTE — Patient Instructions (Signed)
Resume previous diet and medications. ° °Repeat colonoscopy in 10 years. ° ° °YOU HAD AN ENDOSCOPIC PROCEDURE TODAY AT THE [facility]:   Refer to the procedure report that was given to you for any specific questions about what was found during the examination.  If the procedure report does not answer your questions, please call your gastroenterologist to clarify.  If you requested that your care partner not be given the details of your procedure findings, then the procedure report has been included in a sealed envelope for you to review at your convenience later. ° °YOU SHOULD EXPECT: Some feelings of bloating in the abdomen. Passage of more gas than usual.  Walking can help get rid of the air that was put into your GI tract during the procedure and reduce the bloating. If you had a lower endoscopy (such as a colonoscopy or flexible sigmoidoscopy) you may notice spotting of blood in your stool or on the toilet paper. If you underwent a bowel prep for your procedure, you may not have a normal bowel movement for a few days. ° °Please Note:  You might notice some irritation and congestion in your nose or some drainage.  This is from the oxygen used during your procedure.  There is no need for concern and it should clear up in a day or so. ° °SYMPTOMS TO REPORT IMMEDIATELY: ° °Following lower endoscopy (colonoscopy or flexible sigmoidoscopy): ° Excessive amounts of blood in the stool ° Significant tenderness or worsening of abdominal pains ° Swelling of the abdomen that is new, acute ° Fever of 100°F or higher ° ° °For urgent or emergent issues, a gastroenterologist can be reached at any hour by calling [phone]. °Do not use MyChart messaging for urgent concerns.  ° ° °DIET:  We do recommend a small meal at first, but then you may proceed to your regular diet.  Drink plenty of fluids but you should avoid alcoholic beverages for 24 hours. ° °ACTIVITY:  You should plan to take it easy for the rest of  today and you should NOT DRIVE or use heavy machinery until tomorrow (because of the sedation medicines used during the test).   ° °FOLLOW UP: °Our staff will call the number listed on your records 48-72 hours following your procedure to check on you and address any questions or concerns that you may have regarding the information given to you following your procedure. If we do not reach you, we will leave a message.  We will attempt to reach you two times.  During this call, we will ask if you have developed any symptoms of COVID 19. If you develop any symptoms (ie: fever, flu-like symptoms, shortness of breath, cough etc.) before then, please call [phone].  If you test positive for Covid 19 in the 2 weeks post procedure, please call and report this information to us.   ° °If any biopsies were taken you will be contacted by phone or by letter within the next 1-3 weeks.  Please call us at [phone] if you have not heard about the biopsies in 3 weeks.  ° ° °SIGNATURES/CONFIDENTIALITY: °You and/or your care partner have signed paperwork which will be entered into your electronic medical record.  These signatures attest to the fact that that the information above on your After Visit Summary has been reviewed and is understood.  Full responsibility of the confidentiality of this discharge information lies with you and/or your care-partner.  °

## 2020-04-17 NOTE — Progress Notes (Signed)
VS taken by [place] 

## 2020-04-17 NOTE — Progress Notes (Signed)
Report to PACU, RN, vss, BBS= Clear.  

## 2020-04-17 NOTE — Op Note (Signed)
[facility] Patient Name: Megan Dawson Procedure Date: 04/17/2020 11:49 AM MRN: [id number] Endoscopist: Meryl Dare , MD Age: 50 Referring MD:  Date of Birth: 01/31/1971 Gender: Female Account #: [id number] Procedure:                Colonoscopy Indications:              Screening for colorectal malignant neoplasm Medicines:                Monitored Anesthesia Care Procedure:                Pre-Anesthesia Assessment:                           - Prior to the procedure, a History and Physical                            was performed, and patient medications and                            allergies were reviewed. The patient's tolerance of                            previous anesthesia was also reviewed. The risks                            and benefits of the procedure and the sedation                            options and risks were discussed with the patient.                            All questions were answered, and informed consent                            was obtained. Prior Anticoagulants: The patient has                            taken no previous anticoagulant or antiplatelet                            agents. ASA Grade Assessment: II - A patient with                            mild systemic disease. After reviewing the risks                            and benefits, the patient was deemed in                            satisfactory condition to undergo the procedure.                           After obtaining informed consent, the colonoscope  was passed under direct vision. Throughout the                            procedure, the patient's blood pressure, pulse, and                            oxygen saturations were monitored continuously. The                            Olympus CF-HQ190L ([id number]) Colonoscope was                            introduced through the anus and advanced to the the                            cecum,  identified by appendiceal orifice and                            ileocecal valve. The ileocecal valve, appendiceal                            orifice, and rectum were photographed. The quality                            of the bowel preparation was excellent. The                            colonoscopy was performed without difficulty. The                            patient tolerated the procedure well. Scope In: 11:54:55 AM Scope Out: 12:08:04 PM Scope Withdrawal Time: 0 hours 8 minutes 9 seconds  Total Procedure Duration: 0 hours 13 minutes 9 seconds  Findings:                 The perianal and digital rectal examinations were                            normal.                           Internal hemorrhoids were found during                            retroflexion. The hemorrhoids were moderate and                            Grade I (internal hemorrhoids that do not prolapse).                           The exam was otherwise without abnormality on                            direct and retroflexion views. Complications:            No immediate complications.  Estimated blood loss:                            None. Estimated Blood Loss:     Estimated blood loss: none. Impression:               - Internal hemorrhoids.                           - The examination was otherwise normal on direct                            and retroflexion views.                           - No specimens collected. Recommendation:           - Repeat colonoscopy in 10 years for screening                            purposes.                           - Patient has a contact number available for                            emergencies. The signs and symptoms of potential                            delayed complications were discussed with the                            patient. Return to normal activities tomorrow.                            Written discharge instructions were provided to the                             patient.                           - Resume previous diet.                           - Continue present medications. Meryl Dare, MD 04/17/2020 12:12:04 PM This report has been signed electronically.

## 2020-04-21 ENCOUNTER — Telehealth: Payer: Self-pay

## 2020-04-21 ENCOUNTER — Telehealth: Payer: Self-pay | Admitting: *Deleted

## 2020-04-21 NOTE — Telephone Encounter (Signed)
Second attempt follow up call to pt, lm on vm. 

## 2020-04-21 NOTE — Telephone Encounter (Signed)
First attempt, patient answered but hung up.

## 2020-07-21 ENCOUNTER — Ambulatory Visit: Payer: 59

## 2021-02-19 ENCOUNTER — Other Ambulatory Visit: Payer: Self-pay

## 2021-02-19 ENCOUNTER — Encounter (HOSPITAL_COMMUNITY): Payer: Self-pay

## 2021-02-19 ENCOUNTER — Emergency Department (HOSPITAL_COMMUNITY): Payer: 59

## 2021-02-19 ENCOUNTER — Emergency Department (HOSPITAL_COMMUNITY)
Admission: EM | Admit: 2021-02-19 | Discharge: 2021-02-20 | Disposition: A | Discharge status: Home / Self Care | Payer: 59 | Attending: Emergency Medicine | Admitting: Emergency Medicine

## 2021-02-19 DIAGNOSIS — R0602 Shortness of breath: Secondary | ICD-10-CM | POA: Diagnosis not present

## 2021-02-19 DIAGNOSIS — R42 Dizziness and giddiness: Secondary | ICD-10-CM | POA: Insufficient documentation

## 2021-02-19 DIAGNOSIS — R002 Palpitations: Secondary | ICD-10-CM | POA: Diagnosis not present

## 2021-02-19 DIAGNOSIS — R110 Nausea: Secondary | ICD-10-CM | POA: Insufficient documentation

## 2021-02-19 DIAGNOSIS — R072 Precordial pain: Secondary | ICD-10-CM | POA: Insufficient documentation

## 2021-02-19 DIAGNOSIS — R0789 Other chest pain: Secondary | ICD-10-CM | POA: Diagnosis not present

## 2021-02-19 DIAGNOSIS — M549 Dorsalgia, unspecified: Secondary | ICD-10-CM | POA: Insufficient documentation

## 2021-02-19 DIAGNOSIS — R079 Chest pain, unspecified: Secondary | ICD-10-CM

## 2021-02-19 LAB — BASIC METABOLIC PANEL
Anion gap: 12 (ref 5–15)
BUN: 15 mg/dL (ref 6–20)
CO2: 22 mmol/L (ref 22–32)
Calcium: 9.8 mg/dL (ref 8.9–10.3)
Chloride: 103 mmol/L (ref 98–111)
Creatinine, Ser: 0.86 mg/dL (ref 0.44–1.00)
GFR, Estimated: >60 mL/min (ref >60)
Glucose, Bld: 96 mg/dL (ref 70–99)
Potassium: 4 mmol/L (ref 3.5–5.1)
Sodium: 137 mmol/L (ref 135–145)

## 2021-02-19 LAB — CBC
HCT: 42.4 % (ref 36.0–46.0)
Hemoglobin: 13.1 g/dL (ref 12.0–15.0)
MCH: 25.4 pg — ABNORMAL LOW (ref 26.0–34.0)
MCHC: 30.9 g/dL (ref 30.0–36.0)
MCV: 82.3 fL (ref 80.0–100.0)
Platelets: 321 10*3/uL (ref 150–400)
RBC: 5.15 MIL/uL — ABNORMAL HIGH (ref 3.87–5.11)
RDW: 15.3 % (ref 11.5–15.5)
WBC: 7.3 10*3/uL (ref 4.0–10.5)
nRBC: 0 % (ref 0.0–0.2)

## 2021-02-19 LAB — TROPONIN I (HIGH SENSITIVITY): Troponin I (High Sensitivity): 3 ng/L (ref <18)

## 2021-02-19 LAB — I-STAT BETA HCG BLOOD, ED (MC, WL, AP ONLY): I-stat hCG, quantitative: <5 m[IU]/mL (ref <5)

## 2021-02-19 NOTE — ED Triage Notes (Signed)
Pt reports left sided chest pain that's has been off and on for about 1 week. Sts she has been having blurry vision and dizziness that comes and goes also.

## 2021-02-19 NOTE — ED Provider Triage Note (Signed)
Emergency Medicine Provider Triage Evaluation Note  Megan Dawson , a 51 y.o. female  was evaluated in triage.  Pt complains of chest pains that come and go that began last week.  Today she also began to have pain and discomfort down her left arm.  Also with some discomfort between the shoulder blades.  No history of ACS.  No shortness of breath.  Review of Systems  As above Physical Exam  BP 135/85 (BP Location: Right Arm)    Pulse 65    Temp 97.7 F (36.5 C) (Oral)    Resp 18    Ht 5\' 6"  (1.676 m)    Wt 99.8 kg    SpO2 100%    BMI 35.51 kg/m  Gen:   Awake, no distress   Resp:  Normal effort  MSK:   Moves extremities without difficulty  Other:  Regular rate and rhythm auscultated.  Monitor occasionally showing " irregular rhythm" however appears to be due to artifact.  Lung sounds are clear.  Medical Decision Making  Medically screening exam initiated at 9:31 PM.  Appropriate orders placed.  Megan Dawson was informed that the remainder of the evaluation will be completed by another provider, this initial triage assessment does not replace that evaluation, and the importance of remaining in the ED until their evaluation is complete.     Emelda Fear, PA-C 02/19/21 2134

## 2021-02-20 ENCOUNTER — Emergency Department (HOSPITAL_COMMUNITY): Payer: 59

## 2021-02-20 DIAGNOSIS — R079 Chest pain, unspecified: Secondary | ICD-10-CM | POA: Diagnosis not present

## 2021-02-20 LAB — MAGNESIUM: Magnesium: 2 mg/dL (ref 1.7–2.4)

## 2021-02-20 LAB — LIPASE, BLOOD: Lipase: 36 U/L (ref 11–51)

## 2021-02-20 LAB — TROPONIN I (HIGH SENSITIVITY): Troponin I (High Sensitivity): 3 ng/L (ref <18)

## 2021-02-20 MED ORDER — IOHEXOL 350 MG/ML SOLN
75.0000 mL | Freq: Once | INTRAVENOUS | Status: AC | PRN
  Administered 2021-02-20: 75 mL via INTRAVENOUS

## 2021-02-20 NOTE — ED Provider Notes (Signed)
[facility] DEPT Provider Note   CSN: [id number] Arrival date & time: 02/19/21  2031     History  Chief Complaint  Patient presents with   Chest Pain    Megan Dawson is a 51 y.o. female.   Chest Pain Pain location:  Substernal area and L chest Pain quality: pressure and sharp   Pain radiates to:  L jaw, L shoulder and L arm Pain severity:  Moderate Onset quality:  Gradual Duration:  1 week Timing:  Intermittent Progression:  Worsening Chronicity:  New Context: not breathing, not eating, not movement and not trauma   Relieved by:  Nothing Worsened by:  Nothing Ineffective treatments:  Aspirin Associated symptoms: back pain, dizziness, nausea, palpitations and shortness of breath   Associated symptoms: no abdominal pain, no altered mental status, no cough, no diaphoresis, no fever, no headache, no near-syncope, no numbness, no syncope, no vomiting and no weakness    HPI: A 52 year old patient with a history of obesity presents for evaluation of chest pain. Initial onset of pain was approximately 3-6 hours ago. The patient's chest pain is described as heaviness/pressure/tightness, is sharp and is not worse with exertion. The patient complains of nausea. The patient's chest pain is middle- or left-sided, is not well-localized and does radiate to the arms/jaw/neck. The patient denies diaphoresis. The patient has no history of stroke, has no history of peripheral artery disease, has not smoked in the past 90 days, denies any history of treated diabetes, has no relevant family history of coronary artery disease (first degree relative at less than age 42), is not hypertensive and has no history of hypercholesterolemia.   Home Medications Prior to Admission medications   Medication Sig Start Date End Date Taking? Authorizing Provider  ELDERBERRY PO Take by mouth daily.    [provider]  metroNIDAZOLE (FLAGYL) 500 MG tablet Take 1 tablet  (500 mg total) by mouth 2 (two) times daily. 04/02/20   Nicolette Bang, MD  Multiple Vitamin (MULTIVITAMIN) tablet Take 1 tablet by mouth daily.    [provider]  Zinc Sulfate (ZINC-220 PO) Take by mouth daily.    [provider]      Allergies    Patient has no known allergies.    Review of Systems   Review of Systems  Constitutional:  Negative for chills, diaphoresis and fever.  HENT:  Negative for congestion, ear pain and sore throat.   Eyes:  Negative for photophobia and visual disturbance.  Respiratory:  Positive for shortness of breath. Negative for cough, chest tightness and wheezing.   Cardiovascular:  Positive for chest pain and palpitations. Negative for leg swelling, syncope and near-syncope.  Gastrointestinal:  Positive for nausea. Negative for abdominal pain, constipation, diarrhea and vomiting.  Genitourinary:  Negative for dysuria, flank pain and hematuria.  Musculoskeletal:  Positive for back pain. Negative for arthralgias, gait problem, joint swelling, myalgias and neck stiffness.  Skin:  Negative for color change and rash.  Neurological:  Positive for dizziness. Negative for seizures, syncope, weakness, numbness and headaches.  Hematological:  Does not bruise/bleed easily.  Psychiatric/Behavioral:  Negative for confusion and decreased concentration.   All other systems reviewed and are negative.  Physical Exam Updated Vital Signs BP 108/70 (BP Location: Left Arm)    Pulse (!) 56    Temp 98 F (36.7 C) (Oral)    Resp 15    Ht 5\' 6"  (1.676 m)    Wt 99.8 kg  SpO2 100%    BMI 35.51 kg/m  Physical Exam Vitals and nursing note reviewed.  Constitutional:      General: She is not in acute distress.    Appearance: She is well-developed. She is not ill-appearing, toxic-appearing or diaphoretic.  HENT:     Head: Normocephalic and atraumatic.  Eyes:     Extraocular Movements: Extraocular movements intact.     Conjunctiva/sclera: Conjunctivae  normal.  Neck:     Vascular: No JVD.  Cardiovascular:     Rate and Rhythm: Normal rate and regular rhythm.     Heart sounds: Normal heart sounds. No murmur heard. Pulmonary:     Effort: Pulmonary effort is normal. No respiratory distress.     Breath sounds: Normal breath sounds. No decreased breath sounds, wheezing, rhonchi or rales.  Chest:     Chest wall: No tenderness.  Abdominal:     Palpations: Abdomen is soft.     Tenderness: There is no abdominal tenderness.  Musculoskeletal:        General: No swelling. Normal range of motion.     Cervical back: Normal range of motion and neck supple.     Right lower leg: No edema.     Left lower leg: No edema.  Skin:    General: Skin is warm and dry.     Capillary Refill: Capillary refill takes less than 2 seconds.  Neurological:     General: No focal deficit present.     Mental Status: She is alert and oriented to person, place, and time.     Cranial Nerves: No cranial nerve deficit.     Motor: No weakness.  Psychiatric:        Mood and Affect: Mood normal.        Behavior: Behavior normal.    ED Results / Procedures / Treatments   Labs (all labs ordered are listed, but only abnormal results are displayed) Labs Reviewed  CBC - Abnormal; Notable for the following components:      Result Value   RBC 5.15 (*)    MCH 25.4 (*)    All other components within normal limits  BASIC METABOLIC PANEL  LIPASE, BLOOD  MAGNESIUM  I-STAT BETA HCG BLOOD, ED ([facility], [facility], [facility] ONLY)  TROPONIN I (HIGH SENSITIVITY)  TROPONIN I (HIGH SENSITIVITY)    EKG EKG Interpretation  Date/Time:  Friday February 19 2021 20:54:38 EST Ventricular Rate:  65 PR Interval:  131 QRS Duration: 97 QT Interval:  438 QTC Calculation: 456 R Axis:   59 Text Interpretation: Sinus rhythm Abnormal R-wave progression, early transition Baseline wander in lead(s) V6 Confirmed by Godfrey Pick ([id number]) on 02/20/2021 1:09:57 PM  Radiology DG Chest 2 View  Result Date:  02/19/2021 CLINICAL DATA:  cp EXAM: CHEST - 2 VIEW COMPARISON:  Chest x-ray 08/10/2001 report without imaging FINDINGS: The heart and mediastinal contours are within normal limits. No focal consolidation. No pulmonary edema. No pleural effusion. No pneumothorax. No acute osseous abnormality. IMPRESSION: No active cardiopulmonary disease. Electronically Signed   By: Iven Finn M.D.   On: 02/19/2021 21:17   CT Angio Chest PE W and/or Wo Contrast  Result Date: 02/20/2021 CLINICAL DATA:  Left-sided chest pain for 1 week. Rule out pulmonary embolus. EXAM: CT ANGIOGRAPHY CHEST WITH CONTRAST TECHNIQUE: Multidetector CT imaging of the chest was performed using the standard protocol during bolus administration of intravenous contrast. Multiplanar CT image reconstructions and MIPs were obtained to evaluate the vascular anatomy. CONTRAST:  13mL OMNIPAQUE IOHEXOL  350 MG/ML SOLN COMPARISON:  None. FINDINGS: Cardiovascular: Satisfactory opacification of the pulmonary arteries to the segmental level. No evidence of pulmonary embolism. Normal heart size. No pericardial effusion. Mediastinum/Nodes: No enlarged mediastinal, hilar, or axillary lymph nodes. Thyroid gland, trachea, and esophagus demonstrate no significant findings. Lungs/Pleura: No pleural effusion, airspace consolidation, or atelectasis. No suspicious lung nodules identified. Upper Abdomen: Calcifications identified within the upper pole of the left kidney. No acute abnormality identified within the imaged portions of the upper abdomen. Musculoskeletal: No chest wall abnormality. No acute or significant osseous findings. Review of the MIP images confirms the above findings. IMPRESSION: 1. No evidence for acute pulmonary embolus. No acute cardiopulmonary abnormality to explain patient's chest pain. 2. Left renal calculi. Electronically Signed   By: Kerby Moors M.D.   On: 02/20/2021 14:17    Procedures Procedures    Medications Ordered in ED Medications   iohexol (OMNIPAQUE) 350 MG/ML injection 75 mL (75 mLs Intravenous Contrast Given 02/20/21 1354)    ED Course/ Medical Decision Making/ A&P   HEAR Score: 3                       Medical Decision Making  This patient presents to the ED for concern of chest pain, this involves an extensive number of treatment options, and is a complaint that carries with it a high risk of complications and morbidity.  The differential diagnosis includes ACS, PE, aortic dissection, pneumonia, gallbladder disease, pancreatitis, gastritis, GERD, esophageal perforation, pericarditis, costochondritis.   Co morbidities that complicate the patient evaluation  Obesity, possibly other chronic conditions that are undiagnosed.  Per chart review, patient has not seen a doctor and almost 1 year.   Additional history obtained:  Additional history obtained from EMR External records from outside source obtained and reviewed including no documented visits since February of last year.  At that time was concern of rectal bleeding.  She has no known history of cardiac or pulmonary disease.   Lab Tests:  I Ordered, and personally interpreted labs.  The pertinent results include: Normal hemoglobin, no leukocytosis, no thrombocytosis, normal electrolytes, negative pregnancy test, normal troponin x2.   Imaging Studies ordered:  I ordered imaging studies including chest x-ray, CTA chest I independently visualized and interpreted imaging which showed chest x-ray showed normal size cardiac silhouette, no mediastinal free air, no focal opacities in lung fields, no pneumothorax, no evidence of pulmonary edema.  CTA of chest showed no evidence of PE, no areas of opacities, no pleural effusions, no pericardial effusion, and no evidence of chest wall injury.  I agree with the radiologist interpretation   Cardiac Monitoring:  The patient was maintained on a cardiac monitor.  I personally viewed and interpreted the cardiac monitored  which showed an underlying rhythm of: Sinus rhythm with no ST segment changes or T wave abnormalities.   Medicines ordered and prescription drug management:  Patient was asymptomatic during her time of ED observation.  No medications were indicated. On reevaluation, she remained asymptomatic. I have reviewed the patients home medicines and have made adjustments as needed  Problem List / ED Course:  Patient presenting for 1 week of intermittent chest pain with intermittent associated symptoms of shortness of breath, dizziness, nausea, and pain radiating to shoulder and back.  The symptoms are resolved at time of initial assessment.  Based on previous symptoms, EKG findings, and risk factors, patient has a heart score of 3.  She remained asymptomatic during ED observation.  Diagnostic evaluation is reassuring.  Patient is due for her yearly follow-up appointment with her primary care doctor which she plans to schedule.    Reevaluation:  After the interventions noted above, I reevaluated the patient and found that they have : Patient remained asymptomatic.   Social Determinants of Health:  Patient is 51 years old with risk factors of obesity and family history of father having MI at age 52.  She has a primary care doctor but has not been evaluated in the outpatient setting since February.   Dispostion:  After consideration of the diagnostic results and the patients response to treatment, I feel that the patent would benefit from discharge with PCP follow-up and cardiology follow-up as needed.        Final Clinical Impression(s) / ED Diagnoses Final diagnoses:  Chest pain, unspecified type    Rx / DC Orders ED Discharge Orders     None         Godfrey Pick, MD 02/21/21 1214

## 2021-02-20 NOTE — ED Notes (Signed)
Pt in CT at this time.

## 2021-02-20 NOTE — ED Notes (Signed)
Patient was called to go back to a room but no response. Registration mentioned the patient said she was going to the cafeteria.

## 2021-02-20 NOTE — ED Notes (Signed)
Pt stated " this is my threshold" because she was in the cafeteria when called for a room. Pt is agitated.

## 2021-04-16 ENCOUNTER — Ambulatory Visit
Admission: EM | Admit: 2021-04-16 | Discharge: 2021-04-16 | Disposition: A | Discharge status: Home / Self Care | Payer: 59 | Attending: Physician Assistant | Admitting: Physician Assistant

## 2021-04-16 ENCOUNTER — Other Ambulatory Visit: Payer: Self-pay

## 2021-04-16 DIAGNOSIS — J209 Acute bronchitis, unspecified: Secondary | ICD-10-CM

## 2021-04-16 MED ORDER — DOXYCYCLINE HYCLATE 100 MG PO CAPS: 100.0000 mg | ORAL_CAPSULE | Freq: Two times a day (BID) | ORAL | 0 refills | Status: DC

## 2021-04-16 MED ORDER — PREDNISONE 20 MG PO TABS: 40.0000 mg | ORAL_TABLET | Freq: Every day | ORAL | 0 refills | Status: AC

## 2021-04-16 NOTE — ED Provider Notes (Signed)
?[facility] ? ? ? ?CSN: [id number] ?Arrival date & time: 04/16/21  1607 ? ? ?  ? ?History   ?Chief Complaint ?Chief Complaint  ?Patient presents with  ? Cough  ? ? ?HPI ?Megan Dawson is a 51 y.o. female.  ? ?Patient here today for evaluation of cough she has had the last 2 weeks. She reports that she has also had some sinus congestion and headache as well as nausea and diarrhea. She has tried OTC meds without resolution. She denies fever. ? ?The history is provided by the patient.  ? ?Past Medical History:  ?Diagnosis Date  ? Elevated cholesterol   ? ? ?Patient Active Problem List  ? Diagnosis Date Noted  ? Special screening for malignant neoplasms, colon 04/07/2020  ? Rectal bleeding 04/07/2020  ? 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 ? ?Past Surgical History:  ?Procedure Laterality Date  ? KIDNEY STONE SURGERY    ? ? ?OB History   ?No obstetric history on file. ?  ? ? ? ?Home Medications   ? ?Prior to Admission medications   ?Medication Sig Start Date End Date Taking? Authorizing Provider  ?doxycycline (VIBRAMYCIN) 100 MG capsule Take 1 capsule (100 mg total) by mouth 2 (two) times daily. 04/16/21  Yes Tomi Bamberger, PA-C  ?predniSONE (DELTASONE) 20 MG tablet Take 2 tablets (40 mg total) by mouth daily with breakfast for 5 days. 04/16/21 04/21/21 Yes Tomi Bamberger, PA-C  ?ELDERBERRY PO Take by mouth daily.    [provider]  ?metroNIDAZOLE (FLAGYL) 500 MG tablet Take 1 tablet (500 mg total) by mouth 2 (two) times daily. 04/02/20   Arvilla Market, MD  ?Multiple Vitamin (MULTIVITAMIN) tablet Take 1 tablet by mouth daily.    [provider]  ?Zinc Sulfate (ZINC-220 PO) Take by mouth daily.    [provider]  ? ? ?Family History ?Family History  ?Problem Relation Age of Onset  ? Colon cancer Neg Hx   ? Stomach cancer Neg Hx   ? Esophageal cancer Neg Hx   ? Pancreatic cancer Neg Hx   ? Rectal cancer Neg Hx   ? ? ?Social History ?Social History  ? ?Tobacco Use  ?  Smoking status: Former  ? Smokeless tobacco: Never  ? Tobacco comments:  ?  30 years ago   ?Vaping Use  ? Vaping Use: Never used  ?Substance Use Topics  ? Alcohol use: Yes  ?  Comment: occ  ? Drug use: No  ? ? ? ?Allergies   ?Patient has no known allergies. ? ? ?Review of Systems ?Review of Systems  ?Constitutional:  Negative for chills and fever.  ?HENT:  Positive for congestion and sinus pressure. Negative for ear pain and sore throat.   ?Eyes:  Negative for discharge and redness.  ?Respiratory:  Positive for cough. Negative for shortness of breath and wheezing.   ?Gastrointestinal:  Positive for diarrhea and nausea. Negative for abdominal pain and vomiting.  ? ? ?Physical Exam ?Triage Vital Signs ?ED Triage Vitals  ?Enc Vitals Group  ?   BP   ?   Pulse   ?   Resp   ?   Temp   ?   Temp src   ?   SpO2   ?   Weight   ?   Height   ?   Head Circumference   ?   Peak Flow   ?   Pain Score   ?   Pain Loc   ?  Pain Edu?   ?   Excl. in GC?   ? ?No data found. ? ?Updated Vital Signs ?BP 105/66 (BP Location: Left Arm)   Pulse 95   Temp 98 ?F (36.7 ?C) (Oral)   Resp 18   SpO2 95%  ? ?   ? ?Physical Exam ?Vitals and nursing note reviewed.  ?Constitutional:   ?   General: She is not in acute distress. ?   Appearance: Normal appearance. She is not ill-appearing.  ?HENT:  ?   Head: Normocephalic and atraumatic.  ?   Nose: Congestion present.  ?   Mouth/Throat:  ?   Mouth: Mucous membranes are moist.  ?   Pharynx: No oropharyngeal exudate or posterior oropharyngeal erythema.  ?Eyes:  ?   Conjunctiva/sclera: Conjunctivae normal.  ?Cardiovascular:  ?   Rate and Rhythm: Normal rate and regular rhythm.  ?   Heart sounds: Normal heart sounds. No murmur heard. ?Pulmonary:  ?   Effort: Pulmonary effort is normal. No respiratory distress.  ?   Breath sounds: Rhonchi (mild rhonchi noted to left lower lung field) present. No wheezing or rales.  ?Skin: ?   General: Skin is warm and dry.  ?Neurological:  ?   Mental Status: She is alert.   ?Psychiatric:     ?   Mood and Affect: Mood normal.     ?   Thought Content: Thought content normal.  ? ? ? ?UC Treatments / Results  ?Labs ?(all labs ordered are listed, but only abnormal results are displayed) ?Labs Reviewed - No data to display ? ?EKG ? ? ?Radiology ?No results found. ? ?Procedures ?Procedures (including critical care time) ? ?Medications Ordered in UC ?Medications - No data to display ? ?Initial Impression / Assessment and Plan / UC Course  ?I have reviewed the triage vital signs and the nursing notes. ? ?Pertinent labs & imaging results that were available during my care of the patient were reviewed by me and considered in my medical decision making (see chart for details). ? ?  ?Will treat to cover bronchitis as well as possible developing pneumonia given exam. Recommended follow up if symptoms fail to improve or worsen. Patient expresses understanding.  ? ?Final Clinical Impressions(s) / UC Diagnoses  ? ?Final diagnoses:  ?Acute bronchitis, unspecified organism  ? ?Discharge Instructions   ?None ?  ? ?ED Prescriptions   ? ? Medication Sig Dispense Auth. Provider  ? predniSONE (DELTASONE) 20 MG tablet Take 2 tablets (40 mg total) by mouth daily with breakfast for 5 days. 10 tablet Tomi Bamberger, PA-C  ? doxycycline (VIBRAMYCIN) 100 MG capsule Take 1 capsule (100 mg total) by mouth 2 (two) times daily. 20 capsule Tomi Bamberger, PA-C  ? ?  ? ?PDMP not reviewed this encounter. ?  ?Tomi Bamberger, PA-C ?04/16/21 1722 ? ?

## 2021-04-16 NOTE — ED Triage Notes (Signed)
Pt c/o coughing, dizziness, mild sinus congestion, headache, nausea, diarrhea,  ? ?Denies sore throat, earache, vomiting,  ? ?Onset ~ 2 weeks ago  ? ?Added her urine has a sulfur odor.  ?

## 2021-04-21 ENCOUNTER — Ambulatory Visit: Payer: 59 | Admitting: Family Medicine

## 2021-06-01 ENCOUNTER — Ambulatory Visit: Payer: 59 | Admitting: Family Medicine

## 2021-08-11 ENCOUNTER — Ambulatory Visit: Payer: Self-pay

## 2021-08-11 ENCOUNTER — Ambulatory Visit: Payer: Self-pay | Admitting: *Deleted

## 2021-08-11 ENCOUNTER — Ambulatory Visit
Admission: EM | Admit: 2021-08-11 | Discharge: 2021-08-11 | Disposition: A | Discharge status: Home / Self Care | Payer: 59 | Attending: Family Medicine | Admitting: Family Medicine

## 2021-08-11 ENCOUNTER — Ambulatory Visit (INDEPENDENT_AMBULATORY_CARE_PROVIDER_SITE_OTHER): Payer: 59

## 2021-08-11 DIAGNOSIS — R0601 Orthopnea: Secondary | ICD-10-CM | POA: Diagnosis not present

## 2021-08-11 DIAGNOSIS — M791 Myalgia, unspecified site: Secondary | ICD-10-CM | POA: Diagnosis not present

## 2021-08-11 DIAGNOSIS — R8281 Pyuria: Secondary | ICD-10-CM | POA: Diagnosis not present

## 2021-08-11 DIAGNOSIS — R42 Dizziness and giddiness: Secondary | ICD-10-CM

## 2021-08-11 DIAGNOSIS — R0602 Shortness of breath: Secondary | ICD-10-CM | POA: Diagnosis not present

## 2021-08-11 LAB — POCT URINALYSIS DIP (MANUAL ENTRY)
Bilirubin, UA: NEGATIVE
Glucose, UA: NEGATIVE mg/dL
Ketones, POC UA: NEGATIVE mg/dL
Nitrite, UA: POSITIVE — AB
Protein Ur, POC: NEGATIVE mg/dL
Spec Grav, UA: 1.01 (ref 1.010–1.025)
Urobilinogen, UA: 0.2 E.U./dL
pH, UA: 7.5 (ref 5.0–8.0)

## 2021-08-11 MED ORDER — NITROFURANTOIN MONOHYD MACRO 100 MG PO CAPS: 100.0000 mg | ORAL_CAPSULE | Freq: Two times a day (BID) | ORAL | 0 refills | Status: DC

## 2021-08-11 NOTE — Telephone Encounter (Signed)
Chief Complaint: dizziness Symptoms: SOB, blurred vision, back/flank pain, foul odor to urine, aching to right upper shoulder and left arm,sweating, sudden right side neck severe pain (onset during triage call), left leg swelling, pain to left side of knee and swelling, hurts to walk, anxiety, pt stated that when she first get up to walk she "stumbles." Frequency: 2 nights ago worsened last night Pertinent Negatives: Patient denies signs of dehydration, chest pain, vomiting,bleeding, diarrhea, fever Disposition: [x] ED /[] Urgent Care (no appt availability in office) / [] Appointment(In office/virtual)/ []  [facility]/ [] Home Care/ [] Refused Recommended Disposition /[] [facility]/ []  Follow-up with PCP Additional Notes: Pt called while in waiting room at UC at [facility], pt very anxious and noted SOB. Pt began to have sudden sharp pains to the right side of her neck that were severe. Pt stated she has not been seen. Advised pt to advice receptionst about her concerns and sx to ee if they can do VS. ADvised pt needs to be evaluated at ED to r/o heart, brain or kidney issues. Called 911 for pt and during call with 911 the pt was overheard talking and crying to someone there. Shortly after the pt hung up.   Reason for Disposition  [1] MODERATE difficulty breathing (e.g., speaks in phrases, SOB even at rest, pulse 100-120) AND [2] NEW-onset or WORSE than normal  Additional Information  Commented on: Loss of vision or double vision (Exception: similar to previous migraines)    Blurred vision  Answer Assessment - Initial Assessment Questions 1. RESPIRATORY STATUS: "Describe your breathing?" (e.g., wheezing, shortness of breath, unable to speak, severe coughing)      SOB 2. ONSET: "When did this breathing problem begin?"      Started 2 nights ago 3. PATTERN "Does the difficult breathing come and go, or has it been constant since it started?"      Comes and goes 4. SEVERITY:  "How bad is your breathing?" (e.g., mild, moderate, severe)    - MILD: No SOB at rest, mild SOB with walking, speaks normally in sentences, can lie down, no retractions, pulse < 100.    - MODERATE: SOB at rest, SOB with minimal exertion and prefers to sit, cannot lie down flat, speaks in phrases, mild retractions, audible wheezing, pulse 100-120.    - SEVERE: Very SOB at rest, speaks in single words, struggling to breathe, sitting hunched forward, retractions, pulse > 120      moderate 5. RECURRENT SYMPTOM: "Have you had difficulty breathing before?" If Yes, ask: "When was the last time?" and "What happened that time?"      never 6. CARDIAC HISTORY: "Do you have any history of heart disease?" (e.g., heart attack, angina, bypass surgery, angioplasty)      no 7. LUNG HISTORY: "Do you have any history of lung disease?"  (e.g., pulmonary embolus, asthma, emphysema)     No dx with bronchitis few months ago 8. CAUSE: "What do you think is causing the breathing problem?"      unsure 9. OTHER SYMPTOMS: "Do you have any other symptoms? (e.g., dizziness, runny nose, cough, chest pain, fever)     Dizziness when sitting, vision changes (comes and goes)- blurred vision- noticing  10. O2 SATURATION MONITOR:  "Do you use an oxygen saturation monitor (pulse oximeter) at home?" If Yes, "What is your reading (oxygen level) today?" "What is your usual oxygen saturation reading?" (e.g., 95%)       In UC waiting room  Answer Assessment -  Initial Assessment Questions 1. DESCRIPTION: "Describe your dizziness."     lightheadedness 2. LIGHTHEADED: "Do you feel lightheaded?" (e.g., somewhat faint, woozy, weak upon standing)     Weak upon 3. VERTIGO: "Do you feel like either you or the room is spinning or tilting?" (i.e. vertigo)     *No Answer* 4. SEVERITY: "How bad is it?"  "Do you feel like you are going to faint?" "Can you stand and walk?"   - MILD: Feels slightly dizzy, but walking normally.   - MODERATE: Feels  unsteady when walking, but not falling; interferes with normal activities (e.g., school, work).   - SEVERE: Unable to walk without falling, or requires assistance to walk without falling; feels like passing out now.      When first sits up gait is off  5. ONSET:  "When did the dizziness begin?"     Last night worsening 6. AGGRAVATING FACTORS: "Does anything make it worse?" (e.g., standing, change in head position)     standing 7. HEART RATE: "Can you tell me your heart rate?" "How many beats in 15 seconds?"  (Note: not all patients can do this)       *No Answer* 8. CAUSE: "What do you think is causing the dizziness?"     unsure 9. RECURRENT SYMPTOM: "Have you had dizziness before?" If Yes, ask: "When was the last time?" "What happened that time?"     *No Answer* 10. OTHER SYMPTOMS: "Do you have any other symptoms?" (e.g., fever, chest pain, vomiting, diarrhea, bleeding)       Right upper shoulder aching,  11. PREGNANCY: "Is there any chance you are pregnant?" "When was your last menstrual period?"       *No Answer*  Protocols used: Breathing Difficulty-A-AH, Dizziness - Lightheadedness-A-AH

## 2021-08-11 NOTE — Telephone Encounter (Signed)
Spoke w/patients husband and advised him that there was no appt made for 09-21-21. Unsure who told them that. Made pt an appt for 09-06-21.

## 2021-08-11 NOTE — ED Provider Notes (Signed)
[facility]    CSN: [id number] Arrival date & time: 08/11/21  0831      History   Chief Complaint Chief Complaint  Patient presents with   Dizziness    HPI Megan Dawson is a 51 y.o. female.    Dizziness   Here for shortness of breath and orthopnea that began in the last 10 days and worsened overnight.  She also is experiencing dizziness and lightheadedness overnight.  Her face and entire body was tingling when she was brought back for vital signs today.  She also noted a sharp pain in the back of her head began overnight and it was really bothering her when she first was checked in.  She began panicking and having some severe anxiety about her symptoms when she was getting checked in.  No chest pain.  She has been feeling short of breath when she lies down  Maybe she has had some cough or congestion but not much. No dysuria or urinary frequency but she has had some right-sided back pain and her urine has smelled abnormal to her in the last day or so.  She notes pain or aching in her neck now and in both arms.  No recent vomiting or diarrhea  She was on the phone with her primary care office when she was in her front waiting room, and that office had called the ambulance.  Paramedics appeared to take her to the hospital while I was interviewing her.  She and I discussed options and she decided to stay here for evaluation.  Initially heart rate was 130.  During the interview she did become calmer and her heart rate fell to 98 by me.  Then later in the visit it was 25.   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       Home Medications    Prior to Admission medications   Medication Sig Start Date End Date Taking? Authorizing Provider  nitrofurantoin, macrocrystal-monohydrate, (MACROBID) 100 MG capsule Take 1 capsule (100 mg total) by mouth 2 (two) times daily. 08/11/21  Yes Barrett Henle, MD  ELDERBERRY PO Take by mouth daily.    [provider]  Multiple Vitamin (MULTIVITAMIN) tablet Take 1 tablet by mouth daily.    [provider]  Zinc Sulfate (ZINC-220 PO) Take by mouth daily.    [provider]    Family History Family History  Problem Relation Age of Onset   Colon cancer Neg Hx    Stomach cancer Neg Hx    Esophageal cancer Neg Hx    Pancreatic cancer Neg Hx    Rectal cancer Neg Hx     Social History Social History   Tobacco Use   Smoking status: Former   Smokeless tobacco: Never   Tobacco comments:    30 years ago   Vaping Use   Vaping Use: Never used  Substance Use Topics   Alcohol use: Yes    Comment: occ   Drug use: No     Allergies   Patient has no known allergies.   Review of Systems Review of Systems  Neurological:  Positive for dizziness.     Physical Exam Triage Vital Signs ED Triage Vitals  Enc Vitals Group     BP 08/11/21 0912 135/82     Pulse Rate 08/11/21 0909 (!) 130     Resp 08/11/21 0909 20     Temp --      Temp src --      SpO2 08/11/21 0909 99 %     Weight --      Height --      Head Circumference --      Peak Flow --      Pain Score 08/11/21 0909 0     Pain Loc --      Pain Edu? --      Excl. in GC? --    No data found.  Updated Vital Signs BP 135/82 (BP Location: Left Arm)   Pulse 68   Resp 20   SpO2 99%   Visual Acuity Right Eye Distance:   Left Eye Distance:   Bilateral Distance:    Right Eye Near:   Left Eye Near:    Bilateral Near:     Physical Exam Vitals reviewed.  Constitutional:      General: She is not in acute distress.    Appearance: She is not toxic-appearing.  HENT:      Right Ear: Tympanic membrane and ear canal normal.     Left Ear: Tympanic membrane and ear canal normal.     Nose: Nose normal.     Mouth/Throat:     Mouth: Mucous membranes are moist.     Pharynx: No oropharyngeal exudate or posterior oropharyngeal erythema.  Eyes:     Extraocular Movements: Extraocular movements intact.     Conjunctiva/sclera: Conjunctivae normal.     Pupils: Pupils are equal, round, and reactive to light.  Cardiovascular:     Rate and Rhythm: Normal rate and regular rhythm.     Heart sounds: No murmur heard. Pulmonary:     Effort: Pulmonary effort is normal. No respiratory distress.     Breath sounds: No stridor. No wheezing or rhonchi.  Abdominal:     Palpations: Abdomen is soft.     Tenderness: There is no abdominal tenderness. There is no right CVA tenderness or left CVA tenderness.  Musculoskeletal:     Cervical back: Neck supple.     Right lower leg: No edema.     Left lower leg: No edema.  Lymphadenopathy:     Cervical: No cervical adenopathy.  Skin:    Capillary Refill: Capillary refill takes less than 2 seconds.     Coloration: Skin is not jaundiced or pale.  Neurological:     General: No focal deficit present.     Mental Status: She is alert and oriented to person, place, and time.  Psychiatric:        Behavior: Behavior normal.      UC Treatments / Results  Labs (all labs ordered are listed, but only abnormal results are displayed) Labs Reviewed  POCT URINALYSIS DIP (MANUAL ENTRY) - Abnormal; Notable for the following components:      Result Value   Blood, UA trace-intact (*)    Nitrite, UA Positive (*)    Leukocytes, UA Trace (*)    All other components within normal limits  URINE CULTURE  COVID-19, FLU A+B NAA  CBC  COMPREHENSIVE METABOLIC PANEL    EKG   Radiology DG Chest 2 View  Result Date: 08/11/2021 CLINICAL DATA:  Shortness of breath.  Orthopnea. EXAM: CHEST - 2 VIEW COMPARISON:  None Available. FINDINGS: The heart  size and mediastinal contours are within normal limits. Both lungs are clear. The visualized skeletal structures are unremarkable. IMPRESSION: No active cardiopulmonary disease. Electronically Signed   By: Richarda Overlie M.D.   On: 08/11/2021 09:50    Procedures Procedures (including critical care time)  Medications Ordered in UC Medications - No data to display  Initial Impression / Assessment and Plan / UC Course  I have reviewed the triage vital signs and the nursing notes.  Pertinent labs & imaging results that were available during my care of the patient were reviewed by me and considered in my medical decision making (see chart for details).     Chest x-ray is clear.  EKG is normal with normal sinus rhythm and no ST changes.  Urinalysis does show some nitrites, and a small amount of white blood cells and red blood cells.  We will send urine culture, and I will go ahead and treat for possible UTI with her given symptoms.  Also we will do viral swabs for COVID and flu.  Also we will do CBC and chemistry panel.  I discussed with her following up with her primary care office Final Clinical Impressions(s) / UC Diagnoses   Final diagnoses:  Dizziness and giddiness  Orthopnea  Myalgia  Pyuria     Discharge Instructions      Your chest x-ray was clear  Your EKG was normal  Urinalysis showed a tiny amount of white blood cells and nitrites. Urine culture is sent, and staff will call you if it looks like the antibiotic needs to be changed  Take nitrofurantoin 100 mg--1 capsule 2 times daily for 5 days  We have drawn blood for a blood count and for a chemistry that we will check your electrolytes, sugar, and liver and kidney numbers.  That will notify you if there is anything significantly abnormal  Please follow-up with your primary care     ED Prescriptions     Medication Sig Dispense Auth. Provider   nitrofurantoin, macrocrystal-monohydrate, (MACROBID) 100 MG capsule Take  1 capsule (100 mg total) by mouth 2 (two) times daily. 10 capsule Zenia Resides, MD      PDMP not reviewed this encounter.   Zenia Resides, MD 08/11/21 1027

## 2021-08-11 NOTE — ED Triage Notes (Signed)
Pt c/o dizzy, lightheaded, face and arm tingling, sharp pain in back of head since last night. Pt appear in significant distress w/ inconsolable tearfulness.

## 2021-08-11 NOTE — Discharge Instructions (Addendum)
Your chest x-ray was clear  Your EKG was normal  Urinalysis showed a tiny amount of white blood cells and nitrites. Urine culture is sent, and staff will call you if it looks like the antibiotic needs to be changed  Take nitrofurantoin 100 mg--1 capsule 2 times daily for 5 days  We have drawn blood for a blood count and for a chemistry that we will check your electrolytes, sugar, and liver and kidney numbers.  That will notify you if there is anything significantly abnormal  Please follow-up with your primary care

## 2021-08-11 NOTE — Telephone Encounter (Addendum)
Reason for Disposition  Requesting regular office appointment  Protocols used: Information Only Call - No Triage-A-AH   Patient was seen at [facility] today and was advised to schedule appointment. Patient's husband has called to request patient be put on cancellation list- he states the appointment scheduled is 09/21/21 with Dr Andrey Campanile. I did do the cancellation list request- but then noticed there is no appointment scheduled for patient. Can someone who scheduled check this information and contact patient to schedule if not done. I don't want the patient to come to office without an appointment.Office is closed for lunch- message sent.

## 2021-08-11 NOTE — Telephone Encounter (Signed)
Pt needs to schedule appt w/new attending provider Andrey Campanile, MD, has not seen anyone at [facility] since 2021

## 2021-08-12 LAB — COMPREHENSIVE METABOLIC PANEL
ALT: 19 IU/L (ref 0–32)
AST: 23 IU/L (ref 0–40)
Albumin/Globulin Ratio: 1.6 (ref 1.2–2.2)
Albumin: 4.9 g/dL (ref 3.8–4.9)
Alkaline Phosphatase: 75 IU/L (ref 44–121)
BUN/Creatinine Ratio: 19 (ref 9–23)
BUN: 17 mg/dL (ref 6–24)
Bilirubin Total: 0.5 mg/dL (ref 0.0–1.2)
CO2: 19 mmol/L — ABNORMAL LOW (ref 20–29)
Calcium: 10.8 mg/dL — ABNORMAL HIGH (ref 8.7–10.2)
Chloride: 98 mmol/L (ref 96–106)
Creatinine, Ser: 0.91 mg/dL (ref 0.57–1.00)
Globulin, Total: 3 g/dL (ref 1.5–4.5)
Glucose: 76 mg/dL (ref 70–99)
Potassium: 4.9 mmol/L (ref 3.5–5.2)
Sodium: 137 mmol/L (ref 134–144)
Total Protein: 7.9 g/dL (ref 6.0–8.5)
eGFR: 76 mL/min/{1.73_m2} (ref >59)

## 2021-08-12 LAB — CBC
Hematocrit: 41.5 % (ref 34.0–46.6)
Hemoglobin: 13.6 g/dL (ref 11.1–15.9)
MCH: 25.3 pg — ABNORMAL LOW (ref 26.6–33.0)
MCHC: 32.8 g/dL (ref 31.5–35.7)
MCV: 77 fL — ABNORMAL LOW (ref 79–97)
Platelets: 292 10*3/uL (ref 150–450)
RBC: 5.38 x10E6/uL — ABNORMAL HIGH (ref 3.77–5.28)
RDW: 15.9 % — ABNORMAL HIGH (ref 11.7–15.4)
WBC: 5.4 10*3/uL (ref 3.4–10.8)

## 2021-08-12 LAB — COVID-19, FLU A+B NAA
Influenza A, NAA: NOT DETECTED
Influenza B, NAA: NOT DETECTED
SARS-CoV-2, NAA: NOT DETECTED

## 2021-08-13 LAB — URINE CULTURE: Culture: >=100000 — AB

## 2021-09-06 ENCOUNTER — Encounter: Payer: Self-pay | Admitting: Family Medicine

## 2021-09-06 ENCOUNTER — Ambulatory Visit (INDEPENDENT_AMBULATORY_CARE_PROVIDER_SITE_OTHER): Payer: 59 | Admitting: Family Medicine

## 2021-09-06 VITALS — BP 93/64 | HR 60 | Temp 98.1°F | Resp 16 | Ht 66.0 in | Wt 184.4 lb

## 2021-09-06 DIAGNOSIS — G4700 Insomnia, unspecified: Secondary | ICD-10-CM

## 2021-09-06 DIAGNOSIS — N951 Menopausal and female climacteric states: Secondary | ICD-10-CM | POA: Diagnosis not present

## 2021-09-06 DIAGNOSIS — Z1231 Encounter for screening mammogram for malignant neoplasm of breast: Secondary | ICD-10-CM

## 2021-09-06 DIAGNOSIS — F411 Generalized anxiety disorder: Secondary | ICD-10-CM

## 2021-09-06 DIAGNOSIS — R69 Illness, unspecified: Secondary | ICD-10-CM | POA: Diagnosis not present

## 2021-09-06 MED ORDER — MIRTAZAPINE 15 MG PO TABS: 15.0000 mg | ORAL_TABLET | Freq: Every day | ORAL | 1 refills | Status: DC

## 2021-09-06 NOTE — Progress Notes (Unsigned)
Patient is here to reestablish care with PCP. 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

## 2021-09-08 NOTE — Progress Notes (Signed)
Established Patient Office Visit  Subjective    Patient ID: Megan Dawson, female    DOB: [birth year]  Age: 51 y.o. MRN: [id number]  CC:  Chief Complaint  Patient presents with   Follow-up    UC    HPI Megan Dawson presents to establish care with a new provider as well as some anxiety and insomnial. She is wondering if her sx are worse because she is experiencing menopause sxs.   Outpatient Encounter Medications as of 09/06/2021  Medication Sig   mirtazapine (REMERON) 15 MG tablet Take 1 tablet (15 mg total) by mouth at bedtime.   ELDERBERRY PO Take by mouth daily.   Multiple Vitamin (MULTIVITAMIN) tablet Take 1 tablet by mouth daily.   nitrofurantoin, macrocrystal-monohydrate, (MACROBID) 100 MG capsule Take 1 capsule (100 mg total) by mouth 2 (two) times daily.   Zinc Sulfate (ZINC-220 PO) Take by mouth daily.   No facility-administered encounter medications on file as of 09/06/2021.    Past Medical History:  Diagnosis Date   Elevated cholesterol     Past Surgical History:  Procedure Laterality Date   KIDNEY STONE SURGERY      Family History  Problem Relation Age of Onset   Colon cancer Neg Hx    Stomach cancer Neg Hx    Esophageal cancer Neg Hx    Pancreatic cancer Neg Hx    Rectal cancer Neg Hx     Social History   Socioeconomic History   Marital status: Married    Spouse name: Not on file   Number of children: Not on file   Years of education: Not on file   Highest education level: Not on file  Occupational History   Not on file  Tobacco Use   Smoking status: Former   Smokeless tobacco: Never   Tobacco comments:    30 years ago   Vaping Use   Vaping Use: Never used  Substance and Sexual Activity   Alcohol use: Yes    Comment: occ   Drug use: No   Sexual activity: Yes    Birth control/protection: None  Other Topics Concern   Not on file  Social History Narrative   Not on file   Social Determinants of Health   Financial  Resource Strain: Not on file  Food Insecurity: Not on file  Transportation Needs: Not on file  Physical Activity: Not on file  Stress: Not on file  Social Connections: Not on file  Intimate Partner Violence: Not on file    Review of Systems  Psychiatric/Behavioral:  Negative for depression. The patient is nervous/anxious and has insomnia.   All other systems reviewed and are negative.       Objective    BP 93/64   Pulse 60   Temp 98.1 F (36.7 C) (Oral)   Resp 16   Ht 5\' 6"  (1.676 m)   Wt 184 lb 6.4 oz (83.6 kg)   SpO2 98%   BMI 29.76 kg/m   Physical Exam Vitals and nursing note reviewed.  Constitutional:      General: She is not in acute distress. Cardiovascular:     Rate and Rhythm: Normal rate and regular rhythm.  Pulmonary:     Effort: Pulmonary effort is normal.     Breath sounds: Normal breath sounds.  Abdominal:     Palpations: Abdomen is soft.     Tenderness: There is no abdominal tenderness.  Neurological:     General: No focal deficit present.  Mental Status: She is alert and oriented to person, place, and time.  Psychiatric:        Mood and Affect: Affect normal. Mood is anxious.         Assessment & Plan:   1. Perimenopausal Discussed options for sx relief.   2. Anxiety state Remeron 15 mg prescribed. monitor  3. Insomnia, unspecified type As above  4. Encounter for screening mammogram for malignant neoplasm of breast Referral for mammogram - MM Digital Screening; Future    Return in about 4 weeks (around 10/04/2021) for follow up, physical.   Tommie Raymond, MD

## 2021-09-28 ENCOUNTER — Other Ambulatory Visit: Payer: Self-pay | Admitting: Family Medicine

## 2021-09-28 NOTE — Telephone Encounter (Signed)
Requested medication (s) are due for refill today: yes  Requested medication (s) are on the active medication list: yes  Last refill:  09/06/21 #30 1 refills  Future visit scheduled: yes in 1 week  Notes to clinic:  Pharmacy comment: REQUEST FOR 90 DAYS PRESCRIPTION     Requested Prescriptions  Pending Prescriptions Disp Refills   mirtazapine (REMERON) 15 MG tablet [Pharmacy Med Name: MIRTAZAPINE 15 MG TABLET] 90 tablet 1    Sig: TAKE 1 TABLET BY MOUTH EVERYDAY AT BEDTIME     Psychiatry: Antidepressants - mirtazapine Passed - 09/28/2021  2:30 PM      Passed - Valid encounter within last 6 months    Recent Outpatient Visits           3 weeks ago Perimenopausal   Primary Care at [facility], MD   1 year ago Encounter for annual physical exam   Primary Care at [facility], Kandee Keen, MD   1 year ago Encounter to establish care   Primary Care at [facility], Kandee Keen, MD       Future Appointments             In 1 week Georganna Skeans, MD Primary Care at [facility]

## 2021-10-08 ENCOUNTER — Encounter: Payer: 59 | Admitting: Family Medicine

## 2022-02-07 IMAGING — CT CT ANGIO CHEST
3 of 7 series · 18 of 36 positions shown · IV contrast (omnipaque)
Comparison: None.

CLINICAL DATA: Left-sided chest pain for 1 week. Rule out pulmonary
embolus.

EXAM:
CT ANGIOGRAPHY CHEST WITH CONTRAST
TECHNIQUE: Multidetector CT imaging of the chest was performed using the
standard protocol during bolus administration of intravenous
contrast. Multiplanar CT image reconstructions and MIPs were
obtained to evaluate the vascular anatomy.
CONTRAST:  75mL OMNIPAQUE IOHEXOL 350 MG/ML SOLN

[Series 5: thins · axial · 0.62mm/px · z∈[+1365,+1580]mm · 12 of 255 slices shown]
[im 20/255  lung]
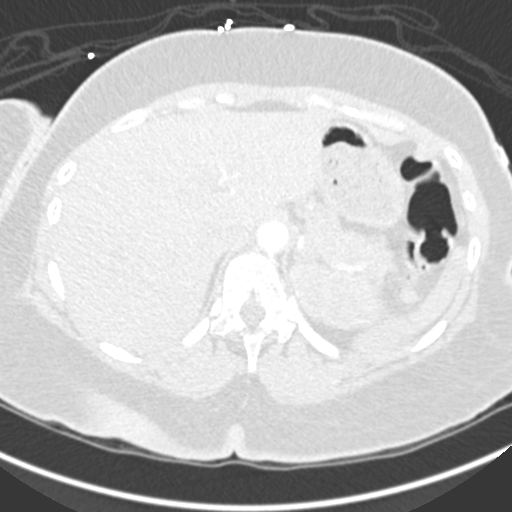
[im 40/255  mediastinal]
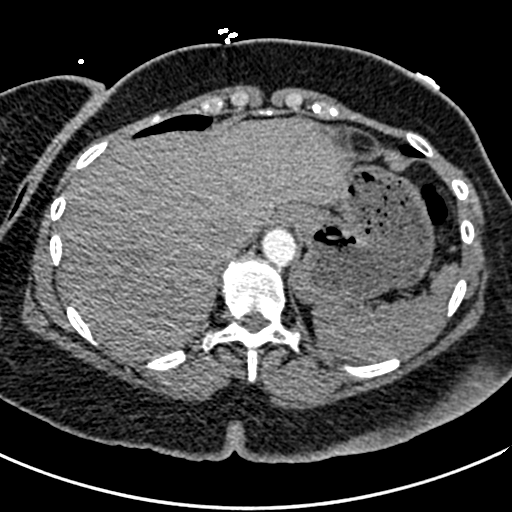
[im 59/255  lung]
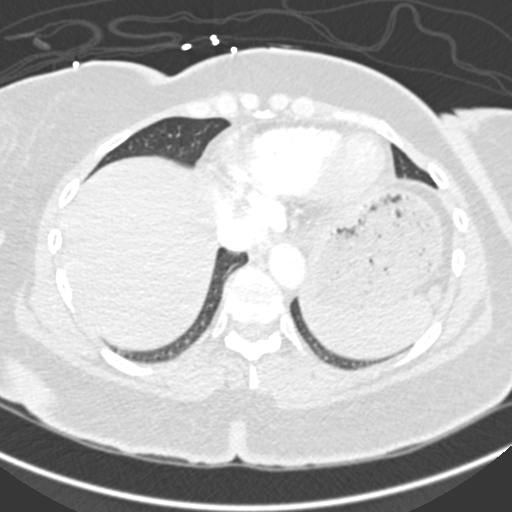
[im 79/255  mediastinal]
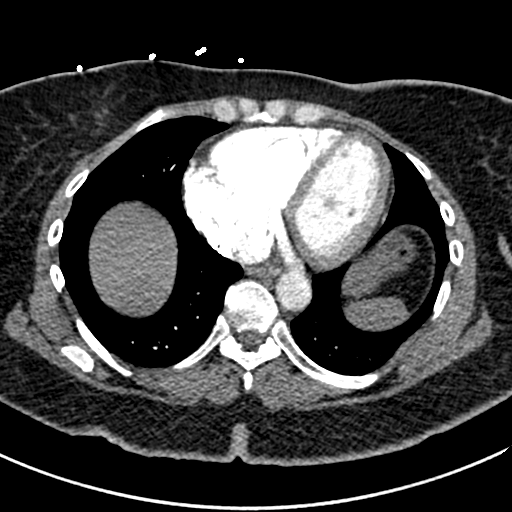
[im 98/255  lung]
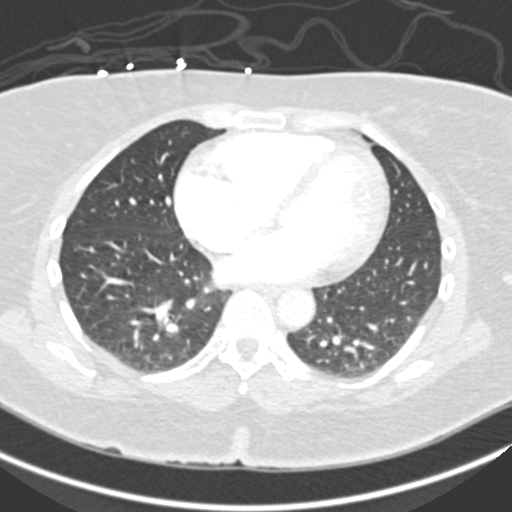
[im 118/255  mediastinal]
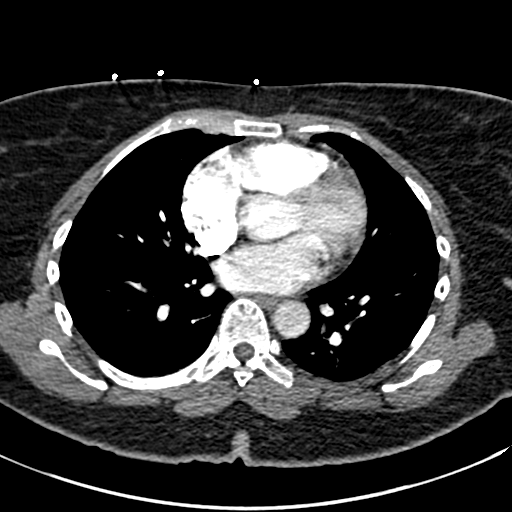
[im 137/255  lung]
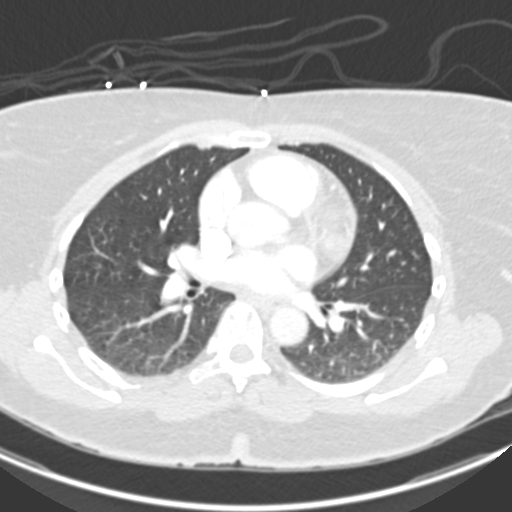
[im 157/255  mediastinal]
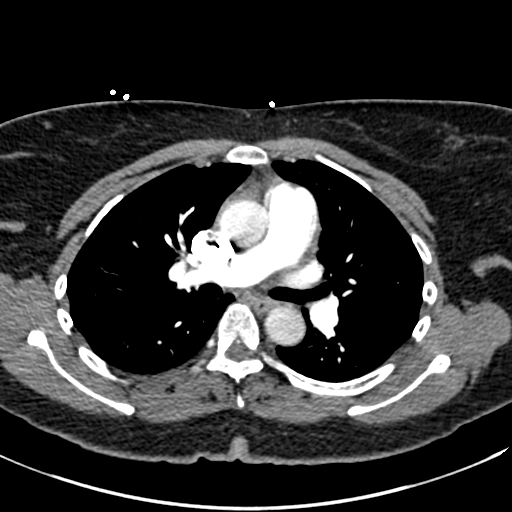
[im 176/255  lung]
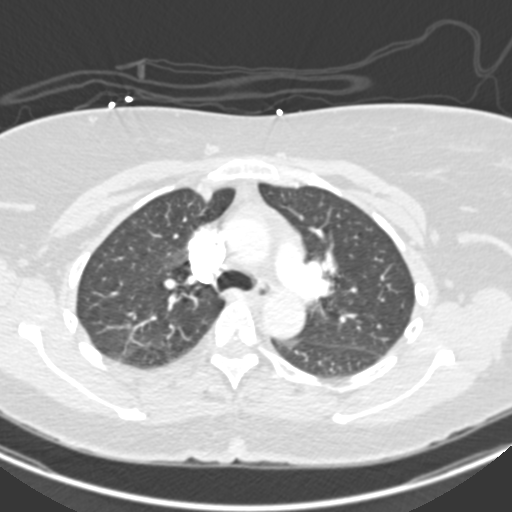
[im 196/255  mediastinal]
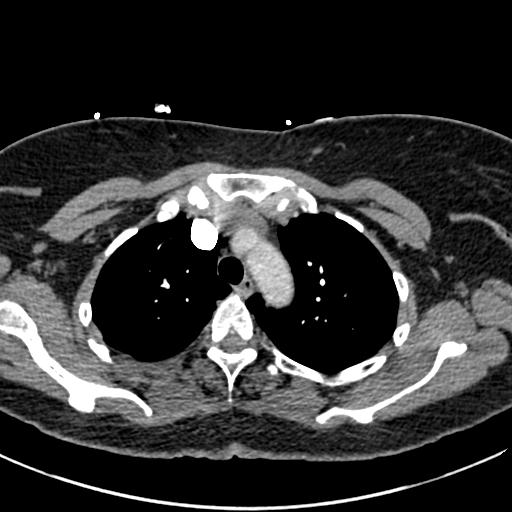
[im 215/255  lung]
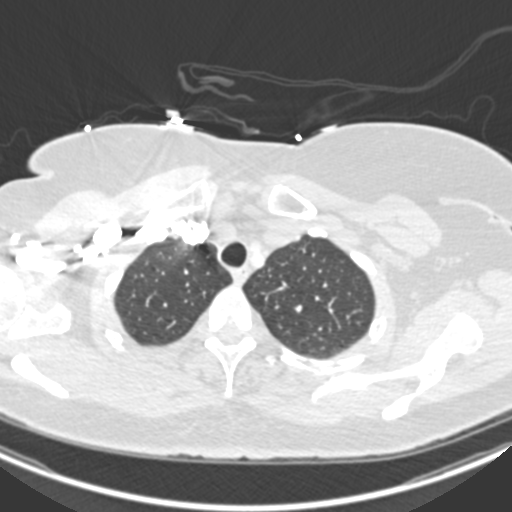
[im 235/255  mediastinal]
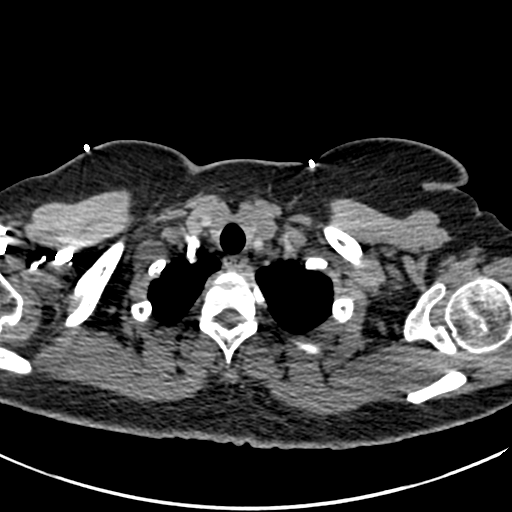

[Series 6: coronal mpr · coronal · 0.52mm/px · 1 of 130 slices shown]
[im 65/130  mediastinal]
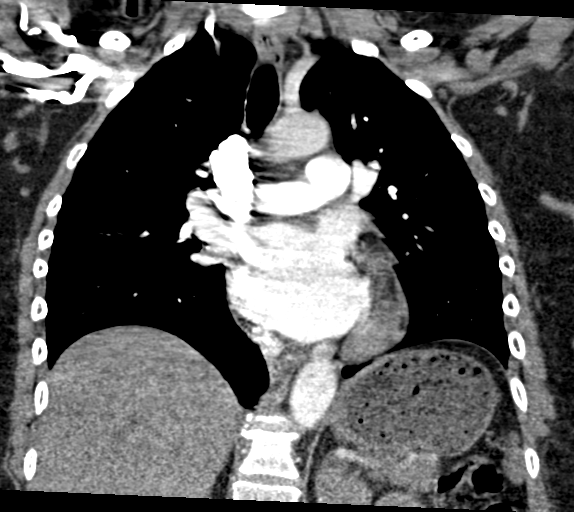

[Series 10: lung · axial · 0.53mm/px · z∈[+1406,+1560]mm · 5 of 117 slices shown]
[im 20/117  mediastinal]
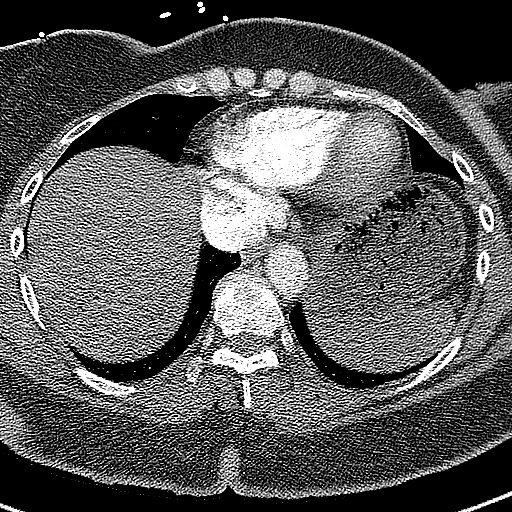
[im 39/117  mediastinal]
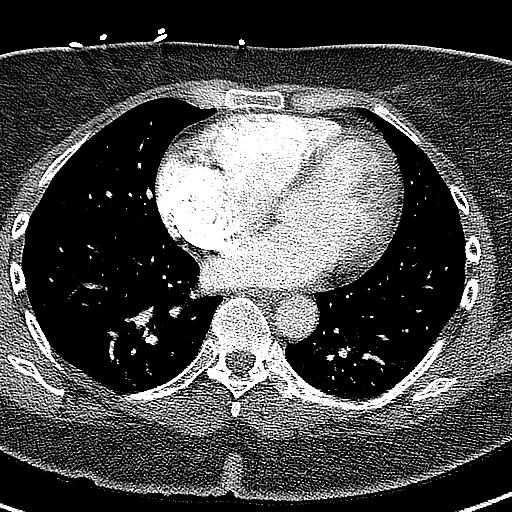
[im 59/117  mediastinal]
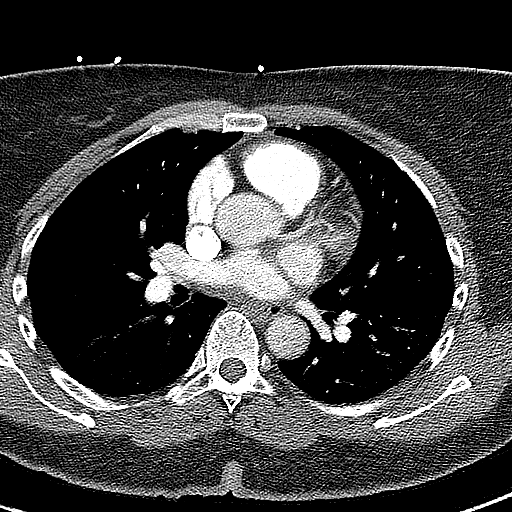
[im 78/117  mediastinal]
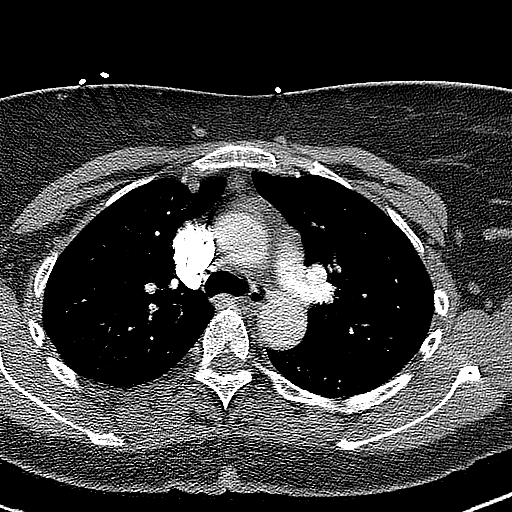
[im 97/117  mediastinal]
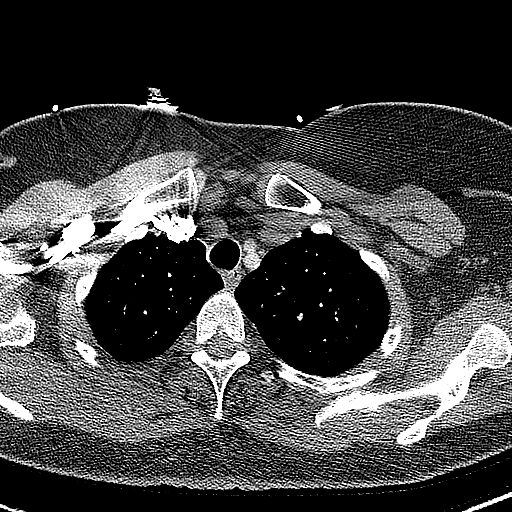

[18 of 36 positions shown; findings below may reference images not displayed]

FINDINGS: Cardiovascular: Satisfactory opacification of the pulmonary arteries
to the segmental level. No evidence of pulmonary embolism. Normal
heart size. No pericardial effusion.

Mediastinum/Nodes: No enlarged mediastinal, hilar, or axillary lymph
nodes. Thyroid gland, trachea, and esophagus demonstrate no
significant findings.

Lungs/Pleura: No pleural effusion, airspace consolidation, or
atelectasis. No suspicious lung nodules identified.

Upper Abdomen: Calcifications identified within the upper pole of
the left kidney. No acute abnormality identified within the imaged
portions of the upper abdomen.

Musculoskeletal: No chest wall abnormality. No acute or significant
osseous findings.

Review of the MIP images confirms the above findings.
IMPRESSION: 1. No evidence for acute pulmonary embolus. No acute cardiopulmonary
abnormality to explain patient's chest pain.
2. Left renal calculi.

## 2022-06-17 ENCOUNTER — Encounter: Payer: Self-pay | Admitting: Family Medicine

## 2022-06-17 ENCOUNTER — Ambulatory Visit (INDEPENDENT_AMBULATORY_CARE_PROVIDER_SITE_OTHER): Payer: 59 | Admitting: Family Medicine

## 2022-06-17 VITALS — BP 131/85 | Temp 97.1°F | Wt 170.0 lb

## 2022-06-17 DIAGNOSIS — Z0289 Encounter for other administrative examinations: Secondary | ICD-10-CM | POA: Diagnosis not present

## 2022-06-20 ENCOUNTER — Ambulatory Visit: Payer: 59

## 2022-07-01 ENCOUNTER — Ambulatory Visit (INDEPENDENT_AMBULATORY_CARE_PROVIDER_SITE_OTHER): Payer: 59

## 2022-07-01 DIAGNOSIS — A150 Tuberculosis of lung: Secondary | ICD-10-CM

## 2022-07-01 DIAGNOSIS — Z111 Encounter for screening for respiratory tuberculosis: Secondary | ICD-10-CM

## 2022-07-01 NOTE — Progress Notes (Unsigned)
Patient came in for TB skin test right for arm

## 2022-07-04 ENCOUNTER — Telehealth (INDEPENDENT_AMBULATORY_CARE_PROVIDER_SITE_OTHER): Payer: 59

## 2022-07-04 DIAGNOSIS — Z111 Encounter for screening for respiratory tuberculosis: Secondary | ICD-10-CM

## 2022-07-04 LAB — TB SKIN TEST: TB Skin Test: NEGATIVE

## 2022-07-04 NOTE — Progress Notes (Signed)
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

## 2022-07-15 ENCOUNTER — Encounter: Payer: Self-pay | Admitting: Family Medicine

## 2022-07-15 NOTE — Progress Notes (Signed)
   Estblished Patient Office Visit  Subjective    Patient ID: TYANAH TIETJEN, female    DOB: Dec 09, 1970  Age: 52 y.o. MRN: [id number]  CC:  Chief Complaint  Patient presents with   paperwork    HPI Megan Dawson presents for completion of forms.    Outpatient Encounter Medications as of 06/17/2022  Medication Sig   ELDERBERRY PO Take by mouth daily.   Multiple Vitamin (MULTIVITAMIN) tablet Take 1 tablet by mouth daily.   Zinc Sulfate (ZINC-220 PO) Take by mouth daily.   [DISCONTINUED] mirtazapine (REMERON) 15 MG tablet TAKE 1 TABLET BY MOUTH EVERYDAY AT BEDTIME   [DISCONTINUED] nitrofurantoin, macrocrystal-monohydrate, (MACROBID) 100 MG capsule Take 1 capsule (100 mg total) by mouth 2 (two) times daily.   No facility-administered encounter medications on file as of 06/17/2022.    Past Medical History:  Diagnosis Date   Elevated cholesterol     Past Surgical History:  Procedure Laterality Date   KIDNEY STONE SURGERY      Family History  Problem Relation Age of Onset   Colon cancer Neg Hx    Stomach cancer Neg Hx    Esophageal cancer Neg Hx    Pancreatic cancer Neg Hx    Rectal cancer Neg Hx     Social History   Socioeconomic History   Marital status: Married    Spouse name: Not on file   Number of children: Not on file   Years of education: Not on file   Highest education level: Not on file  Occupational History   Not on file  Tobacco Use   Smoking status: Former   Smokeless tobacco: Never   Tobacco comments:    30 years ago   Vaping Use   Vaping Use: Never used  Substance and Sexual Activity   Alcohol use: Yes    Comment: occ   Drug use: No   Sexual activity: Yes    Birth control/protection: None  Other Topics Concern   Not on file  Social History Narrative   Not on file   Social Determinants of Health   Financial Resource Strain: Not on file  Food Insecurity: Not on file  Transportation Needs: Not on file  Physical Activity: Not on  file  Stress: Not on file  Social Connections: Not on file  Intimate Partner Violence: Not on file    Review of Systems  All other systems reviewed and are negative.       Objective    BP 131/85   Temp (!) 97.1 F (36.2 C) (Oral)   Wt 170 lb (77.1 kg)   BMI 27.44 kg/m   Physical Exam Vitals and nursing note reviewed.  Constitutional:      General: She is not in acute distress. Cardiovascular:     Rate and Rhythm: Normal rate and regular rhythm.  Pulmonary:     Effort: Pulmonary effort is normal.     Breath sounds: Normal breath sounds.  Neurological:     General: No focal deficit present.     Mental Status: She is alert and oriented to person, place, and time.  Psychiatric:        Mood and Affect: Affect normal. Mood is anxious.         Assessment & Plan:   1. Encounter for completion of form with patient     No follow-ups on file.   Tommie Raymond, MD

## 2022-08-21 ENCOUNTER — Ambulatory Visit (HOSPITAL_COMMUNITY)
Admission: EM | Admit: 2022-08-21 | Discharge: 2022-08-21 | Disposition: A | Discharge status: Home / Self Care | Payer: 59 | Attending: Emergency Medicine | Admitting: Emergency Medicine

## 2022-08-21 ENCOUNTER — Encounter (HOSPITAL_COMMUNITY): Payer: Self-pay

## 2022-08-21 DIAGNOSIS — N3000 Acute cystitis without hematuria: Secondary | ICD-10-CM | POA: Insufficient documentation

## 2022-08-21 LAB — POCT FASTING CBG KUC MANUAL ENTRY: POCT Glucose (KUC): 74 mg/dL (ref 70–99)

## 2022-08-21 LAB — POCT URINALYSIS DIP (MANUAL ENTRY)
Bilirubin, UA: NEGATIVE
Blood, UA: NEGATIVE
Glucose, UA: NEGATIVE mg/dL
Ketones, POC UA: NEGATIVE mg/dL
Leukocytes, UA: NEGATIVE
Nitrite, UA: POSITIVE — AB
Protein Ur, POC: NEGATIVE mg/dL
Spec Grav, UA: 1.015 (ref 1.010–1.025)
Urobilinogen, UA: 0.2 E.U./dL
pH, UA: 6.5 (ref 5.0–8.0)

## 2022-08-21 MED ORDER — NITROFURANTOIN MONOHYD MACRO 100 MG PO CAPS: 100.0000 mg | ORAL_CAPSULE | Freq: Two times a day (BID) | ORAL | 0 refills | Status: DC

## 2022-08-21 NOTE — Discharge Instructions (Addendum)
Your blood sugar was normal.  Your physical exam was reassuring.  Your urine sample came back positive for a bladder infection.  Please take all antibiotics as prescribed and until finished, you can take them with food to help prevent gastrointestinal upset.  Ensure you are drinking at least 64 ounces of water daily.  Avoid overly sugary beverages like sodas or juices.  It is important that you follow-up with your providers regarding symptoms related to menopause.  Please return to clinic for any new or urgent symptoms.

## 2022-08-21 NOTE — ED Provider Notes (Signed)
[facility]    CSN: [id number] Arrival date & time: 08/21/22  1412      History   Chief Complaint No chief complaint on file.   HPI Megan Dawson is a 52 y.o. female.   Patient presents to clinic for multiple symptoms.  She states she has been going through menopause and is having overall body changes.  She feels tingling and hot flashes.  She has bilateral upper eyelid swelling and puffiness, reports her upper lip feels engorged and when she looked a earlier she did notice a little bit of blood.  She was concerned that maybe her blood pressure was elevated so she came to the clinic.  On the way over she also felt nausea.  No emesis.  She has been having intermittent right flank pains, none currently.  Denies any dysuria, urgency, frequency, fevers or recent sick contacts.  Denies weakness, cough, chest pain, shortness of breath or wheezing.   The history is provided by the patient and medical records.    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      Home Medications    Prior to Admission medications   Medication Sig Start Date End Date Taking? Authorizing Provider  nitrofurantoin, macrocrystal-monohydrate, (MACROBID) 100 MG capsule Take 1 capsule (100 mg total) by mouth 2 (two) times daily. 08/21/22  Yes Rinaldo Ratel, [place] N, FNP  ELDERBERRY PO Take by mouth daily.    [provider]  Multiple Vitamin (MULTIVITAMIN) tablet Take 1 tablet by mouth daily.    [provider]  Zinc Sulfate (ZINC-220 PO) Take by mouth daily.    [provider]    Family History Family History  Problem Relation Age of  Onset   Colon cancer Neg Hx    Stomach cancer Neg Hx    Esophageal cancer Neg Hx    Pancreatic cancer Neg Hx    Rectal cancer Neg Hx     Social History Social History   Tobacco Use   Smoking status: Former   Smokeless tobacco: Never   Tobacco comments:    30 years ago   Vaping Use   Vaping Use: Never used  Substance Use Topics   Alcohol use: Yes    Comment: occ   Drug use: No     Allergies   Patient has no known allergies.   Review of Systems Review of Systems  Constitutional:  Negative for fever.  HENT:  Negative for congestion.   Respiratory:  Negative for cough and shortness of breath.   Cardiovascular:  Negative for chest pain.  Gastrointestinal:  Positive for nausea. Negative for abdominal pain.  Genitourinary:  Positive for flank pain.  Neurological:  Positive for numbness. Negative for facial asymmetry and weakness.     Physical Exam Triage Vital Signs ED Triage Vitals  Enc Vitals Group     BP 08/21/22 1419 130/74     Pulse Rate 08/21/22 1419 (!) 48     Resp 08/21/22 1419 20     Temp 08/21/22 1419 97.9 F (36.6 C)     Temp Source 08/21/22 1419 Oral  SpO2 08/21/22 1419 100 %     Weight --      Height --      Head Circumference --      Peak Flow --      Pain Score 08/21/22 1424 0     Pain Loc --      Pain Edu? --      Excl. in GC? --    No data found.  Updated Vital Signs BP 130/74 (BP Location: Left Arm)   Pulse (!) 48   Temp 97.9 F (36.6 C) (Oral)   Resp 20   SpO2 100%   Visual Acuity Right Eye Distance:   Left Eye Distance:   Bilateral Distance:    Right Eye Near:   Left Eye Near:    Bilateral Near:     Physical Exam Vitals and nursing note reviewed.  Constitutional:      Appearance: Normal appearance.  HENT:     Head: Normocephalic and atraumatic.     Right Ear: External ear normal.     Left Ear: External ear normal.     Nose: Nose normal.     Mouth/Throat:     Mouth: Mucous membranes are moist.  Eyes:      General: No scleral icterus.    Extraocular Movements: Extraocular movements intact.     Conjunctiva/sclera: Conjunctivae normal.     Pupils: Pupils are equal, round, and reactive to light.  Cardiovascular:     Rate and Rhythm: Normal rate and regular rhythm.     Heart sounds: Normal heart sounds. No murmur heard. Pulmonary:     Effort: Pulmonary effort is normal. No respiratory distress.     Breath sounds: Normal breath sounds.  Abdominal:     Tenderness: There is no right CVA tenderness or left CVA tenderness.  Musculoskeletal:        General: No swelling. Normal range of motion.  Skin:    General: Skin is warm and dry.  Neurological:     General: No focal deficit present.     Mental Status: She is alert and oriented to person, place, and time.  Psychiatric:        Mood and Affect: Mood normal.      UC Treatments / Results  Labs (all labs ordered are listed, but only abnormal results are displayed) Labs Reviewed  POCT URINALYSIS DIP (MANUAL ENTRY) - Abnormal; Notable for the following components:      Result Value   Clarity, UA hazy (*)    Nitrite, UA Positive (*)    All other components within normal limits  URINE CULTURE  POCT FASTING CBG K[facility] MANUAL ENTRY    EKG   Radiology No results found.  Procedures Procedures (including critical care time)  Medications Ordered in UC Medications - No data to display  Initial Impression / Assessment and Plan / UC Course  I have reviewed the triage vital signs and the nursing notes.  Pertinent labs & imaging results that were available during my care of the patient were reviewed by me and considered in my medical decision making (see chart for details).  Vitals and triage reviewed, patient is hemodynamically stable.  Intermittent numbness and tingling with subjective facial swelling that is bilateral.  Strength is 5 out of 5 in upper and lower extremities.  Cranial nerves II through XII intact, PERRLA.  Negative for CVA  tenderness.  Urinalysis positive for nitrites.  CBG within normal limits.  Will cover with Macrobid for a bladder  infection and send urine for culture.  Encouraged to follow-up with primary care/routine provider for symptoms of menopause.  Plan of care, follow-up care and return precautions given, no questions at this time.     Final Clinical Impressions(s) / UC Diagnoses   Final diagnoses:  Acute cystitis without hematuria     Discharge Instructions      Your blood sugar was normal.  Your physical exam was reassuring.  Your urine sample came back positive for a bladder infection.  Please take all antibiotics as prescribed and until finished, you can take them with food to help prevent gastrointestinal upset.  Ensure you are drinking at least 64 ounces of water daily.  Avoid overly sugary beverages like sodas or juices.  It is important that you follow-up with your providers regarding symptoms related to menopause.  Please return to clinic for any new or urgent symptoms.     ED Prescriptions     Medication Sig Dispense Auth. Provider   nitrofurantoin, macrocrystal-monohydrate, (MACROBID) 100 MG capsule Take 1 capsule (100 mg total) by mouth 2 (two) times daily. 10 capsule Jenelle Drennon, [place] N, FNP      PDMP not reviewed this encounter.   Mohammedali Bedoy, [place] N, [place] 08/21/22 1451

## 2022-08-21 NOTE — ED Triage Notes (Signed)
Pt reports she has tingling in her face, hand, legs and feet, her gums has tingling and swelling, states she tasted blood x 3 days.

## 2022-08-23 LAB — URINE CULTURE: Culture: >=100000 — AB

## 2022-08-24 DIAGNOSIS — Z79890 Hormone replacement therapy (postmenopausal): Secondary | ICD-10-CM | POA: Diagnosis not present

## 2022-08-24 DIAGNOSIS — N760 Acute vaginitis: Secondary | ICD-10-CM | POA: Diagnosis not present

## 2022-09-07 DIAGNOSIS — Z1231 Encounter for screening mammogram for malignant neoplasm of breast: Secondary | ICD-10-CM | POA: Diagnosis not present

## 2022-11-15 DIAGNOSIS — Z79890 Hormone replacement therapy (postmenopausal): Secondary | ICD-10-CM | POA: Diagnosis not present

## 2022-11-15 DIAGNOSIS — F411 Generalized anxiety disorder: Secondary | ICD-10-CM | POA: Diagnosis not present

## 2022-11-15 DIAGNOSIS — Z833 Family history of diabetes mellitus: Secondary | ICD-10-CM | POA: Diagnosis not present

## 2022-11-15 DIAGNOSIS — N905 Atrophy of vulva: Secondary | ICD-10-CM | POA: Diagnosis not present

## 2022-11-15 DIAGNOSIS — Z818 Family history of other mental and behavioral disorders: Secondary | ICD-10-CM | POA: Diagnosis not present

## 2022-11-15 DIAGNOSIS — Z8249 Family history of ischemic heart disease and other diseases of the circulatory system: Secondary | ICD-10-CM | POA: Diagnosis not present

## 2022-11-15 DIAGNOSIS — Z87891 Personal history of nicotine dependence: Secondary | ICD-10-CM | POA: Diagnosis not present

## 2022-11-15 DIAGNOSIS — Z809 Family history of malignant neoplasm, unspecified: Secondary | ICD-10-CM | POA: Diagnosis not present

## 2023-09-27 ENCOUNTER — Ambulatory Visit: Payer: Self-pay

## 2023-09-27 ENCOUNTER — Encounter: Payer: Self-pay | Admitting: Family Medicine

## 2023-09-27 ENCOUNTER — Ambulatory Visit (INDEPENDENT_AMBULATORY_CARE_PROVIDER_SITE_OTHER): Admitting: Family Medicine

## 2023-09-27 VITALS — BP 109/75 | HR 63 | Ht 66.0 in | Wt 179.4 lb

## 2023-09-27 DIAGNOSIS — R300 Dysuria: Secondary | ICD-10-CM | POA: Diagnosis not present

## 2023-09-27 DIAGNOSIS — R5383 Other fatigue: Secondary | ICD-10-CM | POA: Diagnosis not present

## 2023-09-27 DIAGNOSIS — R519 Headache, unspecified: Secondary | ICD-10-CM | POA: Diagnosis not present

## 2023-09-27 MED ORDER — NITROFURANTOIN MONOHYD MACRO 100 MG PO CAPS: 100.0000 mg | ORAL_CAPSULE | Freq: Two times a day (BID) | ORAL | 0 refills | Status: DC

## 2023-09-27 NOTE — Progress Notes (Unsigned)
 Established Patient Office Visit  Subjective    Patient ID: Megan Dawson, female    DOB: Sep 20, 1970  Age: 53 y.o. MRN: [id number]  CC:  Chief Complaint  Patient presents with   Headache    Pt reports headaches off and on for about a week.    Menopause    She has been having body pains (back and chest) and feeling fatigued. She reports hot flashes. She reports foamy pee with a little smell to it.     HPI Megan Dawson presents with complaint of intermittent headache for about one week. Denies known trauma or injury. Patient also reports some dysuria.   Outpatient Encounter Medications as of 09/27/2023  Medication Sig   ELDERBERRY PO Take by mouth daily.   Multiple Vitamin (MULTIVITAMIN) tablet Take 1 tablet by mouth daily.   nitrofurantoin , macrocrystal-monohydrate, (MACROBID ) 100 MG capsule Take 1 capsule (100 mg total) by mouth 2 (two) times daily.   Zinc Sulfate (ZINC-220 PO) Take by mouth daily. (Patient not taking: Reported on 09/27/2023)   [DISCONTINUED] nitrofurantoin , macrocrystal-monohydrate, (MACROBID ) 100 MG capsule Take 1 capsule (100 mg total) by mouth 2 (two) times daily. (Patient not taking: Reported on 09/27/2023)   No facility-administered encounter medications on file as of 09/27/2023.    Past Medical History:  Diagnosis Date   Elevated cholesterol     Past Surgical History:  Procedure Laterality Date   KIDNEY STONE SURGERY      Family History  Problem Relation Age of Onset   Colon cancer Neg Hx    Stomach cancer Neg Hx    Esophageal cancer Neg Hx    Pancreatic cancer Neg Hx    Rectal cancer Neg Hx     Social History   Socioeconomic History   Marital status: Married    Spouse name: Not on file   Number of children: Not on file   Years of education: Not on file   Highest education level: Not on file  Occupational History   Not on file  Tobacco Use   Smoking status: Former   Smokeless tobacco: Never   Tobacco comments:    30  years ago   Vaping Use   Vaping status: Never Used  Substance and Sexual Activity   Alcohol use: Yes    Comment: occ   Drug use: No   Sexual activity: Yes    Birth control/protection: None  Other Topics Concern   Not on file  Social History Narrative   Not on file   Social Drivers of Health   Financial Resource Strain: Not on file  Food Insecurity: Not on file  Transportation Needs: Not on file  Physical Activity: Not on file  Stress: Not on file  Social Connections: Not on file  Intimate Partner Violence: Not on file    Review of Systems  Genitourinary:  Positive for dysuria.  All other systems reviewed and are negative.       Objective    BP 109/75   Pulse 63   Ht 5' 6 (1.676 m)   Wt 179 lb 6.4 oz (81.4 kg)   SpO2 97%   BMI 28.96 kg/m   Physical Exam Vitals and nursing note reviewed.  Constitutional:      General: She is not in acute distress. Cardiovascular:     Rate and Rhythm: Normal rate and regular rhythm.  Pulmonary:     Effort: Pulmonary effort is normal.     Breath sounds: Normal breath sounds.  Neurological:     General: No focal deficit present.     Mental Status: She is alert and oriented to person, place, and time.  Psychiatric:        Mood and Affect: Affect normal. Mood is anxious.         Assessment & Plan:   Nonintractable headache, unspecified chronicity pattern, unspecified headache type -     Comprehensive metabolic panel with GFR  Dysuria  Other fatigue -     Comprehensive metabolic panel with GFR -     CBC with Differential/Platelet  Other orders -     Nitrofurantoin  Monohyd Macro; Take 1 capsule (100 mg total) by mouth 2 (two) times daily.  Dispense: 10 capsule; Refill: 0     No follow-ups on file.   Tanda Raguel SQUIBB, MD

## 2023-09-27 NOTE — Telephone Encounter (Signed)
 FYI Only or Action Required?: FYI only for provider.  Patient was last seen in primary care on 06/17/2022 by [name], MD.  Called Nurse Triage reporting Headache.  Symptoms began several days ago.  Interventions attempted: Nothing.  Symptoms are: unchanged.  Triage Disposition: See Physician Within 24 Hours  Patient/caregiver understands and will follow disposition?: Yes, will follow disposition  Copied from CRM [id number]. Topic: Clinical - Red Word Triage >> Sep 27, 2023  9:28 AM Robinson H wrote: Red Word that prompted transfer to Nurse Triage: Having some serious headaches, back pain and discomfort, nausea, severe anxiety and hot flashes all around fatigue. Reason for Disposition  [1] MODERATE headache (e.g., interferes with normal activities) AND [2] present > 24 hours AND [3] unexplained  (Exceptions: Pain medicines not tried, typical migraine, or headache part of viral illness.)  Answer Assessment - Initial Assessment Questions 1. LOCATION: Where does it hurt?      Depends on the time of day, sometimes temples, sometimes front of head 2. ONSET: When did the headache start? (e.g., minutes, hours, days)      week 3. PATTERN: Does the pain come and go, or has it been constant since it started?     Nagging 4. SEVERITY: How bad is the pain? and What does it keep you from doing?  (e.g., Scale 1-10; mild, moderate, or severe)     Currently 3 5. RECURRENT SYMPTOM: Have you ever had headaches before? If Yes, ask: When was the last time? and What happened that time?      denies 6. CAUSE: What do you think is causing the headache?     menopause 7. MIGRAINE: Have you been diagnosed with migraine headaches? If Yes, ask: Is this headache similar?      denies 8. HEAD INJURY: Has there been any recent injury to your head?      denies 9. OTHER SYMPTOMS: Do you have any other symptoms? (e.g., fever, stiff neck, eye pain, sore throat, cold symptoms)     Back  pain, urine is foamy,  Protocols used: Headache-A-AH

## 2023-09-28 ENCOUNTER — Ambulatory Visit: Payer: Self-pay | Admitting: Family Medicine

## 2023-09-28 LAB — COMPREHENSIVE METABOLIC PANEL WITH GFR
ALT: 32 IU/L (ref 0–32)
AST: 37 IU/L (ref 0–40)
Albumin: 4.5 g/dL (ref 3.8–4.9)
Alkaline Phosphatase: 68 IU/L (ref 44–121)
BUN/Creatinine Ratio: 11 (ref 9–23)
BUN: 9 mg/dL (ref 6–24)
Bilirubin Total: 0.3 mg/dL (ref 0.0–1.2)
CO2: 19 mmol/L — ABNORMAL LOW (ref 20–29)
Calcium: 9.7 mg/dL (ref 8.7–10.2)
Chloride: 95 mmol/L — ABNORMAL LOW (ref 96–106)
Creatinine, Ser: 0.8 mg/dL (ref 0.57–1.00)
Globulin, Total: 2.9 g/dL (ref 1.5–4.5)
Glucose: 78 mg/dL (ref 70–99)
Potassium: 4.4 mmol/L (ref 3.5–5.2)
Sodium: 134 mmol/L (ref 134–144)
Total Protein: 7.4 g/dL (ref 6.0–8.5)
eGFR: 88 mL/min/1.73 (ref >59)

## 2023-09-28 LAB — CBC WITH DIFFERENTIAL/PLATELET
Basophils Absolute: 0.1 x10E3/uL (ref 0.0–0.2)
Basos: 1 %
EOS (ABSOLUTE): 0.1 x10E3/uL (ref 0.0–0.4)
Eos: 2 %
Hematocrit: 41.8 % (ref 34.0–46.6)
Hemoglobin: 12.5 g/dL (ref 11.1–15.9)
Immature Grans (Abs): 0 x10E3/uL (ref 0.0–0.1)
Immature Granulocytes: 0 %
Lymphocytes Absolute: 3.1 x10E3/uL (ref 0.7–3.1)
Lymphs: 52 %
MCH: 24.9 pg — ABNORMAL LOW (ref 26.6–33.0)
MCHC: 29.9 g/dL — ABNORMAL LOW (ref 31.5–35.7)
MCV: 83 fL (ref 79–97)
Monocytes Absolute: 0.5 x10E3/uL (ref 0.1–0.9)
Monocytes: 8 %
Neutrophils Absolute: 2.2 x10E3/uL (ref 1.4–7.0)
Neutrophils: 37 %
Platelets: 290 x10E3/uL (ref 150–450)
RBC: 5.02 x10E6/uL (ref 3.77–5.28)
RDW: 14.5 % (ref 11.7–15.4)
WBC: 5.9 x10E3/uL (ref 3.4–10.8)

## 2023-10-02 ENCOUNTER — Encounter: Payer: Self-pay | Admitting: Family Medicine

## 2023-10-23 ENCOUNTER — Ambulatory Visit: Payer: Self-pay

## 2023-10-23 NOTE — Telephone Encounter (Signed)
 FYI Only or Action Required?: Action required by provider: needs follow up call.  Patient was last seen in primary care on 09/27/2023 by [name], MD.  Called Nurse Triage reporting Urinary Symptoms.  Symptoms began about a month ago.  Interventions attempted: Rest, hydration, or home remedies.  Symptoms are: unchanged.  Triage Disposition: See Physician Within 24 Hours-refused appointment-needs follow up call   Patient/caregiver understands and will follow disposition?: No, refuses disposition  Copied from CRM #[id number]. Topic: Clinical - Red Word Triage >> Oct 23, 2023  9:39 AM [name] wrote: Red Word that prompted transfer to Nurse Triage:  Patient came in August for UTI and was prescribed an Antibiotic - this antibiotic did not provide relief the symptoms or provide treatment at this time, how can we help her get a new treatment plan.  Patient is still having symptoms of UTI - Back pain - Strong Odor in Urine  - Foamy Urine   Warm Transfer to NT Reason for Disposition  Bad or foul-smelling urine  Answer Assessment - Initial Assessment Questions 1. SYMPTOM: What's the main symptom you're concerned about? (e.g., frequency, incontinence)     Urinary frequency, back pain, odor to urine, foamy urine 2. ONSET: When did the  urinary symptoms  start?     Back in August 3. PAIN: Is there any pain? If Yes, ask: How bad is it? (Scale: 1-10; mild, moderate, severe)     Yes-mild pain 4. CAUSE: What do you think is causing the symptoms?     Concerned that UTI never cleared.  5. OTHER SYMPTOMS: Do you have any other symptoms? (e.g., blood in urine, fever, flank pain, pain with urination)     no  Protocols used: Urinary Symptoms-A-AH

## 2023-10-24 ENCOUNTER — Ambulatory Visit (INDEPENDENT_AMBULATORY_CARE_PROVIDER_SITE_OTHER): Admitting: Family Medicine

## 2023-10-24 ENCOUNTER — Other Ambulatory Visit: Payer: Self-pay | Admitting: Family Medicine

## 2023-10-24 ENCOUNTER — Encounter: Payer: Self-pay | Admitting: Family Medicine

## 2023-10-24 VITALS — BP 124/82 | HR 50 | Ht 66.0 in | Wt 179.2 lb

## 2023-10-24 DIAGNOSIS — N951 Menopausal and female climacteric states: Secondary | ICD-10-CM | POA: Diagnosis not present

## 2023-10-24 DIAGNOSIS — R300 Dysuria: Secondary | ICD-10-CM | POA: Diagnosis not present

## 2023-10-24 MED ORDER — VENLAFAXINE HCL 37.5 MG PO TABS: 37.5000 mg | ORAL_TABLET | Freq: Two times a day (BID) | ORAL | 1 refills | Status: AC

## 2023-10-24 NOTE — Progress Notes (Signed)
 Established Patient Office Visit  Subjective    Patient ID: Megan Dawson, female    DOB: Apr 01, 1970  Age: 53 y.o. MRN: [id number]  CC:  Chief Complaint  Patient presents with   Medical Management of Chronic Issues    Pt reports she was told she had a UTI and was given medication but the medication did not make the symptoms go away.    HPI Megan Dawson presents with complaint of menopausal sxs. She has been on hormone therapies previously but has now stopped them. She also reports that her UTI sx never fully resolved.   Outpatient Encounter Medications as of 10/24/2023  Medication Sig   ELDERBERRY PO Take by mouth daily.   Multiple Vitamin (MULTIVITAMIN) tablet Take 1 tablet by mouth daily.   nitrofurantoin , macrocrystal-monohydrate, (MACROBID ) 100 MG capsule Take 1 capsule (100 mg total) by mouth 2 (two) times daily.   venlafaxine  (EFFEXOR ) 37.5 MG tablet Take 1 tablet (37.5 mg total) by mouth 2 (two) times daily.   Zinc Sulfate (ZINC-220 PO) Take by mouth daily. (Patient not taking: Reported on 10/24/2023)   No facility-administered encounter medications on file as of 10/24/2023.    Past Medical History:  Diagnosis Date   Elevated cholesterol     Past Surgical History:  Procedure Laterality Date   KIDNEY STONE SURGERY      Family History  Problem Relation Age of Onset   Colon cancer Neg Hx    Stomach cancer Neg Hx    Esophageal cancer Neg Hx    Pancreatic cancer Neg Hx    Rectal cancer Neg Hx     Social History   Socioeconomic History   Marital status: Married    Spouse name: Not on file   Number of children: Not on file   Years of education: Not on file   Highest education level: Not on file  Occupational History   Not on file  Tobacco Use   Smoking status: Former   Smokeless tobacco: Never   Tobacco comments:    30 years ago   Vaping Use   Vaping status: Never Used  Substance and Sexual Activity   Alcohol use: Yes    Comment: occ   Drug  use: No   Sexual activity: Yes    Birth control/protection: None  Other Topics Concern   Not on file  Social History Narrative   Not on file   Social Drivers of Health   Financial Resource Strain: Not on file  Food Insecurity: Not on file  Transportation Needs: Not on file  Physical Activity: Not on file  Stress: Not on file  Social Connections: Not on file  Intimate Partner Violence: Not on file    Review of Systems  All other systems reviewed and are negative.       Objective    BP 124/82   Pulse (!) 50   Ht 5' 6 (1.676 m)   Wt 179 lb 3.2 oz (81.3 kg)   SpO2 99%   BMI 28.92 kg/m   Physical Exam Vitals and nursing note reviewed.  Constitutional:      General: She is not in acute distress. Cardiovascular:     Rate and Rhythm: Normal rate and regular rhythm.  Pulmonary:     Effort: Pulmonary effort is normal.     Breath sounds: Normal breath sounds.  Neurological:     General: No focal deficit present.     Mental Status: She is alert and oriented to  person, place, and time.  Psychiatric:        Mood and Affect: Affect normal. Mood is anxious.         Assessment & Plan:   Menopausal and female climacteric states  Dysuria -     POCT UA - Glucose/Protein -     Urine Culture  Other orders -     Venlafaxine  HCl; Take 1 tablet (37.5 mg total) by mouth 2 (two) times daily.  Dispense: 60 tablet; Refill: 1     Return in about 4 weeks (around 11/21/2023) for follow up.   Tanda Raguel SQUIBB, MD

## 2023-10-26 LAB — URINE CULTURE

## 2023-11-14 ENCOUNTER — Ambulatory Visit
Admission: EM | Admit: 2023-11-14 | Discharge: 2023-11-14 | Disposition: A | Discharge status: Home / Self Care | Attending: Emergency Medicine | Admitting: Emergency Medicine

## 2023-11-14 ENCOUNTER — Encounter: Payer: Self-pay | Admitting: Emergency Medicine

## 2023-11-14 ENCOUNTER — Other Ambulatory Visit: Payer: Self-pay

## 2023-11-14 DIAGNOSIS — M791 Myalgia, unspecified site: Secondary | ICD-10-CM | POA: Diagnosis not present

## 2023-11-14 DIAGNOSIS — R52 Pain, unspecified: Secondary | ICD-10-CM

## 2023-11-14 MED ORDER — CYCLOBENZAPRINE HCL 10 MG PO TABS: 10.0000 mg | ORAL_TABLET | Freq: Every evening | ORAL | 0 refills | Status: AC

## 2023-11-14 MED ORDER — IBUPROFEN 800 MG PO TABS: 800.0000 mg | ORAL_TABLET | Freq: Three times a day (TID) | ORAL | 0 refills | Status: AC
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
## 2023-11-14 NOTE — Discharge Instructions (Signed)
 You can take the muscle relaxer Flexeril before bedtime (it might make you drowsy).  Ibuprofen can be used every 6 hours (with food)  I also recommend topical treatments like icyhot or biofreeze, and ice or heating pad  It may take several days to a week for symptoms to improve. You may feel worse before you get better.  Please return or follow up with your primary care provider if needed.

## 2023-11-15 ENCOUNTER — Other Ambulatory Visit: Payer: Self-pay | Admitting: Family Medicine

## 2023-11-15 NOTE — Telephone Encounter (Signed)
 Not due yet. Pt was given 30 day supply on 9/9

## 2023-12-04 ENCOUNTER — Ambulatory Visit: Admitting: Family Medicine

## 2024-03-07 ENCOUNTER — Encounter: Admitting: Internal Medicine
